# Patient Record
Sex: Male | Born: 1955 | Race: White | Hispanic: No | Marital: Married | Smoking: Never smoker
Health system: Southern US, Community
[De-identification: ages and names within clinical notes are randomized; demographics above are authoritative.]

## PROBLEM LIST (undated history)

## (undated) DIAGNOSIS — M1990 Unspecified osteoarthritis, unspecified site: Secondary | ICD-10-CM

## (undated) DIAGNOSIS — Z87442 Personal history of urinary calculi: Secondary | ICD-10-CM

## (undated) DIAGNOSIS — E039 Hypothyroidism, unspecified: Secondary | ICD-10-CM

## (undated) DIAGNOSIS — E538 Deficiency of other specified B group vitamins: Secondary | ICD-10-CM

## (undated) DIAGNOSIS — D759 Disease of blood and blood-forming organs, unspecified: Secondary | ICD-10-CM

## (undated) HISTORY — PX: OTHER SURGICAL HISTORY: SHX169

## (undated) HISTORY — PX: TONSILLECTOMY: SUR1361

---

## 2005-04-29 ENCOUNTER — Ambulatory Visit: Payer: Self-pay | Admitting: Unknown Physician Specialty

## 2010-03-15 ENCOUNTER — Ambulatory Visit: Payer: Self-pay | Admitting: Urology

## 2013-01-18 ENCOUNTER — Ambulatory Visit: Payer: Self-pay | Admitting: Internal Medicine

## 2014-01-21 DIAGNOSIS — E039 Hypothyroidism, unspecified: Secondary | ICD-10-CM | POA: Insufficient documentation

## 2014-01-21 DIAGNOSIS — E7800 Pure hypercholesterolemia, unspecified: Secondary | ICD-10-CM | POA: Insufficient documentation

## 2014-04-22 ENCOUNTER — Other Ambulatory Visit
Admit: 2014-04-22 | Disposition: A | Discharge status: Home / Self Care | Payer: Self-pay | Attending: Physician Assistant | Admitting: Physician Assistant

## 2014-04-22 LAB — COMPREHENSIVE METABOLIC PANEL
ALT: 34 U/L
AST: 34 U/L
Albumin: 3.8 g/dL
Alkaline Phosphatase: 71 U/L
Anion Gap: 11 (ref 7–16)
BUN: 26 mg/dL — ABNORMAL HIGH
Bilirubin,Total: 0.7 mg/dL
CALCIUM: 8.6 mg/dL — AB
Chloride: 104 mmol/L
Co2: 25 mmol/L
Creatinine: 1.13 mg/dL
EGFR (African American): >60
GLUCOSE: 91 mg/dL
Potassium: 3.7 mmol/L
Sodium: 140 mmol/L
Total Protein: 6.8 g/dL

## 2015-01-18 DIAGNOSIS — D696 Thrombocytopenia, unspecified: Secondary | ICD-10-CM

## 2015-01-18 HISTORY — DX: Thrombocytopenia, unspecified: D69.6

## 2015-01-23 DIAGNOSIS — D696 Thrombocytopenia, unspecified: Secondary | ICD-10-CM | POA: Insufficient documentation

## 2015-01-23 DIAGNOSIS — M1990 Unspecified osteoarthritis, unspecified site: Secondary | ICD-10-CM | POA: Insufficient documentation

## 2015-01-27 DIAGNOSIS — E538 Deficiency of other specified B group vitamins: Secondary | ICD-10-CM | POA: Insufficient documentation

## 2015-10-12 ENCOUNTER — Other Ambulatory Visit: Payer: Self-pay | Admitting: Physician Assistant

## 2015-10-12 DIAGNOSIS — M2392 Unspecified internal derangement of left knee: Secondary | ICD-10-CM

## 2015-10-15 ENCOUNTER — Ambulatory Visit
Admission: RE | Admit: 2015-10-15 | Discharge: 2015-10-15 | Disposition: A | Discharge status: Home / Self Care | Payer: BC Managed Care – PPO | Source: Ambulatory Visit | Attending: Physician Assistant | Admitting: Physician Assistant

## 2015-10-15 DIAGNOSIS — X58XXXA Exposure to other specified factors, initial encounter: Secondary | ICD-10-CM | POA: Diagnosis not present

## 2015-10-15 DIAGNOSIS — S83502A Sprain of unspecified cruciate ligament of left knee, initial encounter: Secondary | ICD-10-CM | POA: Diagnosis not present

## 2015-10-15 DIAGNOSIS — Q741 Congenital malformation of knee: Secondary | ICD-10-CM | POA: Insufficient documentation

## 2015-10-15 DIAGNOSIS — S83242A Other tear of medial meniscus, current injury, left knee, initial encounter: Secondary | ICD-10-CM | POA: Insufficient documentation

## 2015-10-15 DIAGNOSIS — M1712 Unilateral primary osteoarthritis, left knee: Secondary | ICD-10-CM | POA: Insufficient documentation

## 2015-10-15 DIAGNOSIS — M25462 Effusion, left knee: Secondary | ICD-10-CM | POA: Insufficient documentation

## 2015-10-15 DIAGNOSIS — M71562 Other bursitis, not elsewhere classified, left knee: Secondary | ICD-10-CM | POA: Diagnosis not present

## 2015-10-15 DIAGNOSIS — M84352A Stress fracture, left femur, initial encounter for fracture: Secondary | ICD-10-CM | POA: Diagnosis not present

## 2015-10-15 DIAGNOSIS — M25862 Other specified joint disorders, left knee: Secondary | ICD-10-CM | POA: Diagnosis not present

## 2015-10-15 DIAGNOSIS — M2392 Unspecified internal derangement of left knee: Secondary | ICD-10-CM

## 2015-11-25 ENCOUNTER — Encounter
Admission: RE | Admit: 2015-11-25 | Discharge: 2015-11-25 | Disposition: A | Discharge status: Home / Self Care | Payer: BC Managed Care – PPO | Source: Ambulatory Visit | Attending: Orthopedic Surgery | Admitting: Orthopedic Surgery

## 2015-11-25 HISTORY — DX: Disease of blood and blood-forming organs, unspecified: D75.9

## 2015-11-25 HISTORY — DX: Deficiency of other specified B group vitamins: E53.8

## 2015-11-25 HISTORY — DX: Unspecified osteoarthritis, unspecified site: M19.90

## 2015-11-25 HISTORY — DX: Hypothyroidism, unspecified: E03.9

## 2015-11-25 NOTE — Patient Instructions (Signed)
  Your procedure is scheduled on: 12/02/15 Report to Day Surgery.[facility] To find out your arrival time please call [phone] between 1PM - 3PM on 12/01/15  Remember: Instructions that are not followed completely may result in serious medical risk, up to and including death, or upon the discretion of your surgeon and anesthesiologist your surgery may need to be rescheduled.    _X___ 1. Do not eat food or drink liquids after midnight. No gum chewing or hard candies.     ____ 2. No Alcohol for 24 hours before or after surgery.   ____ 3. Do Not Smoke For 24 Hours Prior to Your Surgery.   ____ 4. Bring all medications with you on the day of surgery if instructed.    _X___ 5. Notify your doctor if there is any change in your medical condition     (cold, fever, infections).       Do not wear jewelry, make-up, hairpins, clips or nail polish.  Do not wear lotions, powders, or perfumes. You may wear deodorant.  Do not shave 48 hours prior to surgery. Men may shave face and neck.  Do not bring valuables to the hospital.    [facility] any belongings or valuables.               Contacts, dentures or bridgework may not be worn into surgery.  Leave your suitcase in the car. After surgery it may be brought to your room.  For patients admitted to the hospital, discharge time is determined by your                treatment team.   Patients discharged the day of surgery will not be allowed to drive home.      ___X Take these medicines the morning of surgery with A SIP OF WATER:    1.LEVOTHYROXINE  2.   3.   4.  5.  6.  ____ Fleet Enema (as directed)   __X__ Use CHG Soap as directed  ____ Use inhalers on the day of surgery  ____ Stop metformin 2 days prior to surgery    ____ Take 1/2 of usual insulin dose the night before surgery and none on the morning of surgery.   ____ Stop Coumadin/Plavix/aspirin on   __X__ Stop Anti-inflammatories on    STOP IBUPROFEN UNTIL AFTER SURGERY   ____ Stop supplements until after surgery.    ____ Bring C-Pap to the hospital.

## 2015-11-26 ENCOUNTER — Ambulatory Visit
Admission: RE | Admit: 2015-11-26 | Discharge: 2015-11-26 | Disposition: A | Discharge status: Home / Self Care | Payer: BC Managed Care – PPO | Source: Ambulatory Visit | Attending: Orthopedic Surgery | Admitting: Orthopedic Surgery

## 2015-11-26 DIAGNOSIS — Z01812 Encounter for preprocedural laboratory examination: Secondary | ICD-10-CM | POA: Diagnosis not present

## 2015-11-26 LAB — CBC
HCT: 45.9 % (ref 40.0–52.0)
Hemoglobin: 15.6 g/dL (ref 13.0–18.0)
MCH: 30 pg (ref 26.0–34.0)
MCHC: 34.1 g/dL (ref 32.0–36.0)
MCV: 88 fL (ref 80.0–100.0)
Platelets: 130 10*3/uL — ABNORMAL LOW (ref 150–440)
RBC: 5.21 MIL/uL (ref 4.40–5.90)
RDW: 13.5 % (ref 11.5–14.5)
WBC: 6.4 10*3/uL (ref 3.8–10.6)

## 2015-12-02 ENCOUNTER — Ambulatory Visit
Admission: RE | Admit: 2015-12-02 | Discharge: 2015-12-02 | Disposition: A | Discharge status: Home / Self Care | Payer: BC Managed Care – PPO | Source: Ambulatory Visit | Attending: Orthopedic Surgery | Admitting: Orthopedic Surgery

## 2015-12-02 ENCOUNTER — Ambulatory Visit: Payer: BC Managed Care – PPO | Admitting: Certified Registered Nurse Anesthetist

## 2015-12-02 ENCOUNTER — Encounter
Admission: RE | Disposition: A | Discharge status: Home / Self Care | Payer: Self-pay | Source: Ambulatory Visit | Attending: Orthopedic Surgery

## 2015-12-02 ENCOUNTER — Encounter: Payer: Self-pay | Admitting: Orthopedic Surgery

## 2015-12-02 DIAGNOSIS — Z820 Family history of epilepsy and other diseases of the nervous system: Secondary | ICD-10-CM | POA: Diagnosis not present

## 2015-12-02 DIAGNOSIS — E039 Hypothyroidism, unspecified: Secondary | ICD-10-CM | POA: Insufficient documentation

## 2015-12-02 DIAGNOSIS — Z791 Long term (current) use of non-steroidal anti-inflammatories (NSAID): Secondary | ICD-10-CM | POA: Insufficient documentation

## 2015-12-02 DIAGNOSIS — M1712 Unilateral primary osteoarthritis, left knee: Secondary | ICD-10-CM | POA: Insufficient documentation

## 2015-12-02 DIAGNOSIS — M25462 Effusion, left knee: Secondary | ICD-10-CM | POA: Insufficient documentation

## 2015-12-02 DIAGNOSIS — M94262 Chondromalacia, left knee: Secondary | ICD-10-CM | POA: Diagnosis not present

## 2015-12-02 DIAGNOSIS — Z8042 Family history of malignant neoplasm of prostate: Secondary | ICD-10-CM | POA: Diagnosis not present

## 2015-12-02 DIAGNOSIS — Z9889 Other specified postprocedural states: Secondary | ICD-10-CM | POA: Diagnosis not present

## 2015-12-02 DIAGNOSIS — M23222 Derangement of posterior horn of medial meniscus due to old tear or injury, left knee: Secondary | ICD-10-CM | POA: Diagnosis not present

## 2015-12-02 DIAGNOSIS — Z79899 Other long term (current) drug therapy: Secondary | ICD-10-CM | POA: Insufficient documentation

## 2015-12-02 DIAGNOSIS — E538 Deficiency of other specified B group vitamins: Secondary | ICD-10-CM | POA: Insufficient documentation

## 2015-12-02 DIAGNOSIS — Z86010 Personal history of colonic polyps: Secondary | ICD-10-CM | POA: Diagnosis not present

## 2015-12-02 DIAGNOSIS — M2392 Unspecified internal derangement of left knee: Secondary | ICD-10-CM | POA: Diagnosis present

## 2015-12-02 HISTORY — PX: KNEE ARTHROSCOPY WITH MEDIAL MENISECTOMY: SHX5651

## 2015-12-02 HISTORY — PX: CHONDROPLASTY: SHX5177

## 2015-12-02 SURGERY — ARTHROSCOPY, KNEE, WITH MEDIAL MENISCECTOMY
Anesthesia: General | Site: Knee | Laterality: Left | Wound class: Clean

## 2015-12-02 MED ORDER — MORPHINE SULFATE (PF) 4 MG/ML IV SOLN
INTRAVENOUS | Status: AC
  Filled 2015-12-02: qty 1

## 2015-12-02 MED ORDER — MORPHINE SULFATE (PF) 4 MG/ML IV SOLN
INTRAVENOUS | Status: DC | PRN
  Administered 2015-12-02: 4 mg

## 2015-12-02 MED ORDER — HYDROCODONE-ACETAMINOPHEN 5-325 MG PO TABS
ORAL_TABLET | ORAL | Status: AC
  Filled 2015-12-02: qty 1

## 2015-12-02 MED ORDER — ONDANSETRON HCL 4 MG/2ML IJ SOLN
INTRAMUSCULAR | Status: DC | PRN
  Administered 2015-12-02: 4 mg via INTRAVENOUS

## 2015-12-02 MED ORDER — FENTANYL CITRATE (PF) 100 MCG/2ML IJ SOLN
25.0000 ug | INTRAMUSCULAR | Status: DC | PRN
  Administered 2015-12-02 (×2): 50 ug via INTRAVENOUS

## 2015-12-02 MED ORDER — MIDAZOLAM HCL 2 MG/2ML IJ SOLN
INTRAMUSCULAR | Status: DC | PRN
  Administered 2015-12-02: 1 mg via INTRAVENOUS

## 2015-12-02 MED ORDER — ACETAMINOPHEN 10 MG/ML IV SOLN
INTRAVENOUS | Status: AC
  Filled 2015-12-02: qty 100

## 2015-12-02 MED ORDER — FAMOTIDINE 20 MG PO TABS
20.0000 mg | ORAL_TABLET | Freq: Once | ORAL | Status: AC
  Administered 2015-12-02: 20 mg via ORAL

## 2015-12-02 MED ORDER — FENTANYL CITRATE (PF) 100 MCG/2ML IJ SOLN
INTRAMUSCULAR | Status: DC | PRN
  Administered 2015-12-02: 25 ug via INTRAVENOUS
  Administered 2015-12-02: 50 ug via INTRAVENOUS

## 2015-12-02 MED ORDER — LACTATED RINGERS IV SOLN
INTRAVENOUS | Status: DC
  Administered 2015-12-02: 11:00:00 via INTRAVENOUS

## 2015-12-02 MED ORDER — BUPIVACAINE-EPINEPHRINE (PF) 0.25% -1:200000 IJ SOLN
INTRAMUSCULAR | Status: AC
  Filled 2015-12-02: qty 30

## 2015-12-02 MED ORDER — PROPOFOL 10 MG/ML IV BOLUS
INTRAVENOUS | Status: DC | PRN
  Administered 2015-12-02: 150 mg via INTRAVENOUS

## 2015-12-02 MED ORDER — FAMOTIDINE 20 MG PO TABS
ORAL_TABLET | ORAL | Status: AC
  Administered 2015-12-02: 20 mg via ORAL
  Filled 2015-12-02: qty 1

## 2015-12-02 MED ORDER — KETAMINE HCL 50 MG/ML IJ SOLN
INTRAMUSCULAR | Status: DC | PRN
  Administered 2015-12-02: 45 mg via INTRAVENOUS

## 2015-12-02 MED ORDER — ONDANSETRON HCL 4 MG PO TABS
4.0000 mg | ORAL_TABLET | Freq: Four times a day (QID) | ORAL | Status: DC | PRN
Start: 2015-12-02 — End: 2015-12-02

## 2015-12-02 MED ORDER — ACETAMINOPHEN 10 MG/ML IV SOLN
INTRAVENOUS | Status: DC | PRN
  Administered 2015-12-02: 1000 mg via INTRAVENOUS

## 2015-12-02 MED ORDER — MEPERIDINE HCL 25 MG/ML IJ SOLN: 6.2500 mg | INTRAMUSCULAR | Status: DC | PRN

## 2015-12-02 MED ORDER — FENTANYL CITRATE (PF) 100 MCG/2ML IJ SOLN
INTRAMUSCULAR | Status: AC
  Filled 2015-12-02: qty 2

## 2015-12-02 MED ORDER — CHLORHEXIDINE GLUCONATE 4 % EX LIQD
60.0000 mL | Freq: Once | CUTANEOUS | Status: AC
  Administered 2015-12-02: 4 via TOPICAL

## 2015-12-02 MED ORDER — LIDOCAINE HCL (CARDIAC) 20 MG/ML IV SOLN
INTRAVENOUS | Status: DC | PRN
  Administered 2015-12-02: 100 mg via INTRAVENOUS

## 2015-12-02 MED ORDER — BUPIVACAINE-EPINEPHRINE 0.25% -1:200000 IJ SOLN
INTRAMUSCULAR | Status: DC | PRN
  Administered 2015-12-02: 25 mL
  Administered 2015-12-02: 5 mL

## 2015-12-02 MED ORDER — GLYCOPYRROLATE 0.2 MG/ML IJ SOLN
INTRAMUSCULAR | Status: DC | PRN
  Administered 2015-12-02: 0.3 mg via INTRAVENOUS

## 2015-12-02 MED ORDER — METOCLOPRAMIDE HCL 5 MG/ML IJ SOLN: 5.0000 mg | Freq: Three times a day (TID) | INTRAMUSCULAR | Status: DC | PRN

## 2015-12-02 MED ORDER — SODIUM CHLORIDE 0.9 % IV SOLN: INTRAVENOUS | Status: DC

## 2015-12-02 MED ORDER — HYDROCODONE-ACETAMINOPHEN 5-325 MG PO TABS
1.0000 | ORAL_TABLET | ORAL | Status: DC | PRN
  Administered 2015-12-02: 1 via ORAL

## 2015-12-02 MED ORDER — METOCLOPRAMIDE HCL 10 MG PO TABS
5.0000 mg | ORAL_TABLET | Freq: Three times a day (TID) | ORAL | Status: DC | PRN
Start: 2015-12-02 — End: 2015-12-02

## 2015-12-02 MED ORDER — PROMETHAZINE HCL 25 MG/ML IJ SOLN: 6.2500 mg | INTRAMUSCULAR | Status: DC | PRN

## 2015-12-02 MED ORDER — HYDROCODONE-ACETAMINOPHEN 5-325 MG PO TABS: 1.0000 | ORAL_TABLET | ORAL | 0 refills | Status: DC | PRN

## 2015-12-02 MED ORDER — OXYCODONE HCL 5 MG PO TABS: 5.0000 mg | ORAL_TABLET | Freq: Once | ORAL | Status: DC | PRN

## 2015-12-02 MED ORDER — ONDANSETRON HCL 4 MG/2ML IJ SOLN: 4.0000 mg | Freq: Four times a day (QID) | INTRAMUSCULAR | Status: DC | PRN

## 2015-12-02 MED ORDER — OXYCODONE HCL 5 MG/5ML PO SOLN: 5.0000 mg | Freq: Once | ORAL | Status: DC | PRN

## 2015-12-02 SURGICAL SUPPLY — 23 items
BLADE SHAVER 4.5 DBL SERAT CV (CUTTER) ×3 IMPLANT
BNDG ESMARK 6X12 TAN STRL LF (GAUZE/BANDAGES/DRESSINGS) ×3 IMPLANT
CUFF TOURN 24 STER (MISCELLANEOUS) ×3 IMPLANT
CUFF TOURN 30 STER DUAL PORT (MISCELLANEOUS) IMPLANT
DRSG DERMACEA 8X12 NADH (GAUZE/BANDAGES/DRESSINGS) ×3 IMPLANT
DURAPREP 26ML APPLICATOR (WOUND CARE) ×6 IMPLANT
GAUZE SPONGE 4X4 12PLY STRL (GAUZE/BANDAGES/DRESSINGS) ×3 IMPLANT
GLOVE BIOGEL M STRL SZ7.5 (GLOVE) ×3 IMPLANT
GLOVE INDICATOR 8.0 STRL GRN (GLOVE) ×3 IMPLANT
GOWN STRL REUS W/ TWL LRG LVL3 (GOWN DISPOSABLE) ×4 IMPLANT
GOWN STRL REUS W/TWL LRG LVL3 (GOWN DISPOSABLE) ×2
IV LACTATED RINGER IRRG 3000ML (IV SOLUTION) ×6
IV LR IRRIG 3000ML ARTHROMATIC (IV SOLUTION) ×12 IMPLANT
KIT RM TURNOVER STRD PROC AR (KITS) ×3 IMPLANT
MANIFOLD NEPTUNE II (INSTRUMENTS) ×3 IMPLANT
PACK ARTHROSCOPY KNEE (MISCELLANEOUS) ×3 IMPLANT
SET TUBE SUCT SHAVER OUTFL 24K (TUBING) ×3 IMPLANT
SET TUBE TIP INTRA-ARTICULAR (MISCELLANEOUS) ×3 IMPLANT
SUT ETHILON 3-0 FS-10 30 BLK (SUTURE) ×3
SUTURE EHLN 3-0 FS-10 30 BLK (SUTURE) ×2 IMPLANT
TUBING ARTHRO INFLOW-ONLY STRL (TUBING) ×3 IMPLANT
WAND HAND CNTRL MULTIVAC 50 (MISCELLANEOUS) ×3 IMPLANT
WRAP KNEE W/COLD PACKS 25.5X14 (SOFTGOODS) ×3 IMPLANT

## 2015-12-02 NOTE — Op Note (Signed)
OPERATIVE NOTE  DATE OF SURGERY:  12/02/2015  PATIENT NAME:  Darrold SpanCharles R Wigen   DOB: 08-05-1955  MRN: [id number]   PRE-OPERATIVE DIAGNOSIS:  Internal derangement of the left knee   POST-OPERATIVE DIAGNOSIS:   Tear of the posterior horn of the medial meniscus, left knee Grade 3 chondromalacia of the medial femoral condyle, left knee  PROCEDURE:  Left knee arthroscopy, partial medial meniscectomy, and chondroplasty  SURGEON:  Jena GaussJames P Ulises Wolfinger, Jr., M.D.   ASSISTANT: none  ANESTHESIA: general  ESTIMATED BLOOD LOSS: Minimal  FLUIDS REPLACED: 900 mL of crystalloid  TOURNIQUET TIME: Not used   DRAINS: none  IMPLANTS UTILIZED: None  INDICATIONS FOR SURGERY: Darrold SpanCharles R Winburn is a 60 y.o. year old male who has been seen for complaints of left knee pain. MRI demonstrated findings consistent with meniscal pathology. After discussion of the risks and benefits of surgical intervention, the patient expressed understanding of the risks benefits and agree with plans for left knee arthroscopy.   PROCEDURE IN DETAIL: The patient was brought into the operating room and, after adequate general anesthesia was achieved, a tourniquet was applied to the left thigh and the leg was placed in the leg holder. All bony prominences were well padded. The patient's left knee was cleaned and prepped with alcohol and Duraprep and draped in the usual sterile fashion. A "timeout" was performed as per usual protocol. The anticipated portal sites were injected with 0.25% Marcaine with epinephrine. An anterolateral incision was made and a cannula was inserted. A small effusion was evacuated and the knee was distended with fluid using the pump. The scope was advanced down the medial gutter into the medial compartment. Under visualization with the scope, an anteromedial portal was created and a hooked probe was inserted. The medial meniscus was visualized and probed. There was a complex tear of the posterior horn of medial  meniscus. The tear was debrided using meniscal punches and a 4.5 mm shaver. Final contouring was performed using a 50 ArthroCare wand. The remaining rim of meniscus was visualized and probed and felt to be stable. The articular cartilage was visualized. There was a localized area of grade 3 chondromalacia involving the medial femoral condyle. The area was debrided and contoured using the ArthroCare wand.  The scope was then advanced into the intercondylar notch. The anterior cruciate ligament was visualized and probed and felt to be intact. The scope was removed from the lateral portal and reinserted via the anteromedial portal to better visualize the lateral compartment. The lateral meniscus was visualized and probed. The lateral meniscus was intact without evidence of tear or instability. The articular cartilage of the lateral compartment was visualized and felt to be in good condition. Finally, the scope was advanced so as to visualize the patellofemoral articulation which was in good condition.Peri Jefferson. Good patellar tracking was appreciated.   The knee was irrigated with copius amounts of fluid and suctioned dry. The anterolateral portal was re-approximated with #3-0 nylon. A combination of 0.25% Marcaine with epinephrine and 4 mg of Morphine were injected via the scope. The scope was removed and the anteromedial portal was re-approximated with #3-0 nylon. A sterile dressing was applied followed by application of an ice wrap.  The patient tolerated the procedure well and was transported to the PACU in stable condition.  Quentavious Rittenhouse P. Angie FavaHooten, Jr., M.D.

## 2015-12-02 NOTE — Brief Op Note (Signed)
12/02/2015  12:54 PM  PATIENT:  Justin Lara    PRE-OPERATIVE DIAGNOSIS:  INTERNAL DERANGEMENT of the left knee  POST-OPERATIVE DIAGNOSIS:  tear posterior horn medial meniscus, grade 3 chondromalcia medial femoral chondyle left knee  PROCEDURE:  Procedure(s): KNEE ARTHROSCOPY WITH MEDIAL MENISECTOMY (Left) CHONDROPLASTY (Left)  SURGEON:  Surgeon(s) and Role:    * Donato HeinzJames P Guillermo Difrancesco, MD - Primary  ASSISTANTS: none   ANESTHESIA:   general  EBL:  Total I/O In: 900 [I.V.:900] Out: 1 [Blood:1]  BLOOD ADMINISTERED:none  DRAINS: none   LOCAL MEDICATIONS USED:  MARCAINE     SPECIMEN:  No Specimen  DISPOSITION OF SPECIMEN:  N/A  COUNTS:  YES  TOURNIQUET:    DICTATION: .Dragon Dictation  PLAN OF CARE: Discharge to home after PACU  PATIENT DISPOSITION:  PACU - hemodynamically stable.   Delay start of Pharmacological VTE agent (>24hrs) due to surgical blood loss or risk of bleeding: not applicable

## 2015-12-02 NOTE — Progress Notes (Signed)
Pedal pulse positive to left foot 

## 2015-12-02 NOTE — Anesthesia Procedure Notes (Signed)
Procedure Name: LMA Insertion Date/Time: 12/02/2015 11:29 AM Performed by: Shirlee LimerickMARION, Justin Sandler Pre-anesthesia Checklist: Patient identified, Emergency Drugs available, Suction available and Patient being monitored Patient Re-evaluated:Patient Re-evaluated prior to inductionOxygen Delivery Method: Circle system utilized Preoxygenation: Pre-oxygenation with 100% oxygen Intubation Type: IV induction LMA: LMA inserted LMA Size: 5.0 Number of attempts: 1 Placement Confirmation: breath sounds checked- equal and bilateral Tube secured with: Tape Dental Injury: Teeth and Oropharynx as per pre-operative assessment

## 2015-12-02 NOTE — Progress Notes (Signed)
States no real pain   No bleeding noted on dressing

## 2015-12-02 NOTE — Anesthesia Preprocedure Evaluation (Signed)
Anesthesia Evaluation  Patient identified by MRN, date of birth, ID band Patient awake    Reviewed: Allergy & Precautions, NPO status , Patient's Chart, lab work & pertinent test results  History of Anesthesia Complications Negative for: history of anesthetic complications  Airway Mallampati: II  TM Distance: >3 FB Neck ROM: Full    Dental  (+) Implants   Pulmonary neg pulmonary ROS, neg sleep apnea, neg COPD,    breath sounds clear to auscultation- rhonchi (-) wheezing      Cardiovascular Exercise Tolerance: Good (-) hypertension(-) CAD and (-) Past MI  Rhythm:Regular Rate:Normal - Systolic murmurs and - Diastolic murmurs    Neuro/Psych negative neurological ROS  negative psych ROS   GI/Hepatic negative GI ROS, Neg liver ROS,   Endo/Other  neg diabetesHypothyroidism   Renal/GU negative Renal ROS     Musculoskeletal  (+) Arthritis ,   Abdominal (+) - obese,   Peds  Hematology negative hematology ROS (+)   Anesthesia Other Findings Past Medical History: No date: Arthritis No date: Blood dyscrasia     Comment: thrombocytopenia No date: Deficiency of other specified B group vitamins*     Comment: b 12 No date: Hypothyroidism   Reproductive/Obstetrics                             Anesthesia Physical Anesthesia Plan  ASA: II  Anesthesia Plan: General   Post-op Pain Management:    Induction: Intravenous  Airway Management Planned: LMA  Additional Equipment:   Intra-op Plan:   Post-operative Plan:   Informed Consent: I have reviewed the patients History and Physical, chart, labs and discussed the procedure including the risks, benefits and alternatives for the proposed anesthesia with the patient or authorized representative who has indicated his/her understanding and acceptance.   Dental advisory given  Plan Discussed with: CRNA and Anesthesiologist  Anesthesia Plan  Comments:         Lab Results  Component Value Date   WBC 6.4 11/26/2015   HGB 15.6 11/26/2015   HCT 45.9 11/26/2015   MCV 88.0 11/26/2015   PLT 130 (L) 11/26/2015    Anesthesia Quick Evaluation

## 2015-12-02 NOTE — H&P (Signed)
The patient has been re-examined, and the chart reviewed, and there have been no interval changes to the documented history and physical.    The risks, benefits, and alternatives have been discussed at length. The patient expressed understanding of the risks benefits and agreed with plans for surgical intervention.  Aamirah Salmi P. Lucan Riner, Jr. M.D.    

## 2015-12-02 NOTE — Transfer of Care (Signed)
Immediate Anesthesia Transfer of Care Note  Patient: Justin Lara  Procedure(s) Performed: Procedure(s): KNEE ARTHROSCOPY WITH MEDIAL MENISECTOMY (Left) CHONDROPLASTY (Left)  Patient Location: PACU  Anesthesia Type:General  Level of Consciousness: awake and patient cooperative  Airway & Oxygen Therapy: Patient Spontanous Breathing and Patient connected to nasal cannula oxygen  Post-op Assessment: Report given to RN and Post -op Vital signs reviewed and stable  Post vital signs: Reviewed and stable  Last Vitals:  Vitals:   12/02/15 1010 12/02/15 1256  BP: 121/76 131/82  Pulse: (!) 54 71  Resp: 16 11  Temp: 36.9 C 36.3 C    Last Pain:  Vitals:   12/02/15 1010  TempSrc: Tympanic         Complications: No apparent anesthesia complications

## 2015-12-02 NOTE — Discharge Instructions (Signed)
°  Instructions after Knee Arthroscopy  ° ° Brande Uncapher P. Caleb Decock, Jr., M.D.    ° Dept. of Orthopaedics & Sports Medicine ° [facility] ° [address] ° [place], [place]  [address] ° ° Phone: [phone]   Fax: [phone] ° ° °DIET: °• Drink plenty of non-alcoholic fluids & begin a light diet. °• Resume your normal diet the day after surgery. ° °ACTIVITY:  °• You may use crutches or a walker with weight-bearing as tolerated, unless instructed otherwise. °• You may wean yourself off of the walker or crutches as tolerated.  °• Begin doing gentle exercises. Exercising will reduce the pain and swelling, increase motion, and prevent muscle weakness.   °• Avoid strenuous activities or athletics for a minimum of 4-6 weeks after arthroscopic surgery. °• Do not drive or operate any equipment until instructed. ° °WOUND CARE:  °• Place one to two pillows under the knee the first day or two when sitting or lying.  °• Continue to use the ice packs periodically to reduce pain and swelling. °• The small incisions in your knee are closed with nylon stitches. The stitches will be removed in the office. °• The bulky dressing may be removed on the second day after surgery. DO NOT TOUCH THE STITCHES. Put a Band-Aid over each stitch. Do NOT use any ointments or creams on the incisions.  °• You may bathe or shower after the stitches are removed at the first office visit following surgery. ° °MEDICATIONS: °• You may resume your regular medications. °• Please take the pain medication as prescribed. °• Do not take pain medication on an empty stomach. °• Do not drive or drink alcoholic beverages when taking pain medications. ° °CALL THE OFFICE FOR: °• Temperature above 101 degrees °• Excessive bleeding or drainage on the dressing. °• Excessive swelling, coldness, or paleness of the toes. °• Persistent nausea and vomiting. ° °FOLLOW-UP:  °• You should have an appointment to return to the office in 7-10 days after surgery.  °  °

## 2015-12-02 NOTE — Anesthesia Postprocedure Evaluation (Signed)
Anesthesia Post Note  Patient: Darrold SpanCharles R Freitas  Procedure(s) Performed: Procedure(s) (LRB): KNEE ARTHROSCOPY WITH MEDIAL MENISECTOMY (Left) CHONDROPLASTY (Left)  Patient location during evaluation: PACU Anesthesia Type: General Level of consciousness: awake and alert and oriented Pain management: pain level controlled Vital Signs Assessment: post-procedure vital signs reviewed and stable Respiratory status: spontaneous breathing, nonlabored ventilation and respiratory function stable Cardiovascular status: blood pressure returned to baseline and stable Postop Assessment: no signs of nausea or vomiting Anesthetic complications: no    Last Vitals:  Vitals:   12/02/15 1355 12/02/15 1425  BP: 137/79 133/77  Pulse: (!) 54 (!) 50  Resp: 16   Temp: 36.4 C     Last Pain:  Vitals:   12/02/15 1358  TempSrc:   PainSc: 3                  Alexcia [employer]

## 2015-12-03 ENCOUNTER — Encounter: Payer: Self-pay | Admitting: Orthopedic Surgery

## 2017-07-17 ENCOUNTER — Other Ambulatory Visit: Payer: Self-pay | Admitting: Family Medicine

## 2017-07-17 DIAGNOSIS — R1011 Right upper quadrant pain: Secondary | ICD-10-CM

## 2017-07-18 ENCOUNTER — Ambulatory Visit
Admission: RE | Admit: 2017-07-18 | Discharge: 2017-07-18 | Disposition: A | Discharge status: Home / Self Care | Payer: BC Managed Care – PPO | Source: Ambulatory Visit | Attending: Family Medicine | Admitting: Family Medicine

## 2017-07-18 ENCOUNTER — Other Ambulatory Visit (HOSPITAL_COMMUNITY): Payer: Self-pay | Admitting: Family Medicine

## 2017-07-18 DIAGNOSIS — K8020 Calculus of gallbladder without cholecystitis without obstruction: Secondary | ICD-10-CM | POA: Insufficient documentation

## 2017-07-18 DIAGNOSIS — R1011 Right upper quadrant pain: Secondary | ICD-10-CM | POA: Diagnosis not present

## 2017-07-18 DIAGNOSIS — N281 Cyst of kidney, acquired: Secondary | ICD-10-CM

## 2017-07-18 DIAGNOSIS — K7689 Other specified diseases of liver: Secondary | ICD-10-CM | POA: Insufficient documentation

## 2017-07-25 ENCOUNTER — Ambulatory Visit
Admission: RE | Admit: 2017-07-25 | Discharge: 2017-07-25 | Disposition: A | Discharge status: Home / Self Care | Payer: BC Managed Care – PPO | Source: Ambulatory Visit | Attending: Family Medicine | Admitting: Family Medicine

## 2017-07-25 DIAGNOSIS — N281 Cyst of kidney, acquired: Secondary | ICD-10-CM | POA: Diagnosis not present

## 2017-07-25 DIAGNOSIS — K8020 Calculus of gallbladder without cholecystitis without obstruction: Secondary | ICD-10-CM

## 2017-07-25 DIAGNOSIS — N200 Calculus of kidney: Secondary | ICD-10-CM | POA: Diagnosis present

## 2017-07-26 ENCOUNTER — Ambulatory Visit: Payer: Self-pay | Admitting: Surgery

## 2017-07-26 NOTE — H&P (Signed)
Progress Notes by Sung AmabileSakai, Deandra Gadson, DO at 07/24/2017 9:45 AM   Author: Sung AmabileSakai, Paxten Appelt, DO Service: - Author Type: Physician  Filed: 07/24/2017 12:50 PM Encounter Date: 07/24/2017 Note Type: Progress Notes  Editor: Sung AmabileSakai, Keaundra Stehle, DO (Physician)     Show:Clear all [x] Manual[x] Template[x] Copied  Added by: [x] Dossie Swor, DO  [] Hover for details Subjective:   CC: chronic cholecystitis  HPI:  Justin Lara is a 62 y.o. male who was referred by PA whitaker for evaluation of  Chronic cholecystitis.  Symptoms were first noted 2 weeks ago. Symptoms did not start at work. Pain is sharp, intermittent started at night and woke him up from sleep.  Lasted couple hours and resolved on own.  Another similar episode happened a week ago at which point workup started and revealed possible chronic cholecystitis on US so referred to us.  Exacerbated by nothing specific.  Alleviated by nothing specific.  Cannot make any associations with food, specific body positions/movements.     Past Medical History:  has a past medical history of Allergic rhinitis, B12 deficiency (01/27/2015), Chicken pox, Chronic osteoarthritis (01/23/2015), OA (osteoarthritis), Pure hypercholesterolemia (01/21/2014), and Thrombocytopenia (CMS-HCC) (01/23/2015). Past Surgical History:  has a past surgical history that includes Tonsillectomy (1973); Colonoscopy (04/29/2005); Colonoscopy (03/31/2010); Colonoscopy (03/25/2015); and Left knee arthroscopy, partial medial meniscetomy and chondroplasty (12/02/2015).  Family History: family history includes Parkinsonism in his mother; Prostate cancer in his father.   Social History:  reports that he has never smoked. He has never used smokeless tobacco. He reports that he does not drink alcohol or use drugs.   Current Medications: has a current medication list which includes the following prescription(s): cyanocobalamin and levothyroxine.  Allergies:  No Known Allergies   ROS: A comprehensive  review of systems was asked and was negative.     Objective:     BP 106/70   Pulse 61   Temp 36.6 C (97.8 F) (Oral)   Ht 183 cm (6' 0.05")   Wt 97.5 kg (215 lb)   BMI 29.12 kg/m   Constitutional :  alert, appears stated age and cooperative  Throat:  no asymmetry, masses, or scars  Respiratory:  clear to auscultation bilaterally  Cardiovascular:  regular rate and rhythm, S1, S2 normal, no murmur, click, rub or gallop and regular rate and rhythm  Gastrointestinal: soft, non-tender; bowel sounds normal; no masses,  no organomegaly.  Negative Murphy's sign  Musculoskeletal: Steady gait and movement  Skin: Cool and moist, no surgical scars  Psychiatric: Normal affect, non-agitated, not confused       LABS:  -      Lab Results  Component Value Date   WBC 7.6 07/17/2017   HGB 15.4 07/17/2017   HCT 44.9 07/17/2017   PLT 145 (L) 07/17/2017   -      Lab Results  Component Value Date   NA 143 07/17/2017   K 3.8 07/17/2017   CL 107 07/17/2017   CO2 30.0 07/17/2017   BUN 20 07/17/2017   CREATININE 1.1 07/17/2017   CALCIUM 9.2 07/17/2017   ALB 4.1 07/17/2017   TBILI 0.4 07/17/2017   ALKPHOS 63 07/17/2017   AST 15 07/17/2017   ALT 13 07/17/2017   GLUCOSE 74 07/17/2017   GFR 67 07/17/2017     RADS: Procedure Name Priority Date/Time Associated Diagnosis Comments  US ABDOMEN LIMITED RUQ STAT 07/18/2017 11:44 AM EDT RUQ abdominal pain  Results for this procedure are in the results section.   Imaging Results - documented in this  encounter  US ABDOMEN LIMITED RUQ (07/18/2017 11:44 AM EDT)    US ABDOMEN LIMITED RUQ (07/18/2017 11:44 AM EDT)  Narrative Performed At  CLINICAL DATA:RUQ abdominal pain for the past 3 days    EXAM:  ULTRASOUND ABDOMEN LIMITED RIGHT UPPER QUADRANT    COMPARISON:None.    FINDINGS:  Gallbladder:    The gallbladder is adequately distended. Stones measuring up to 1.6  cm are present. There is  thickening of the gallbladder wall to 8.3  mm and there is pericholecystic fluid. There is no positive  sonographic Murphy's sign.    Common bile duct:    Diameter: 5.3 mm    Liver:    The hepatic echotexture is normal. There are multiple hepatic cysts.  The largest lies in the right lobe and measures 2.8 x 2.2 x 1.3 cm.  There is no intrahepatic ductal dilation. The portal vein is patent  with normal flow direction toward the liver.    IMPRESSION:  Gallstones with findings that likely reflect subacute or chronic  cholecystitis.    Simple appearing hepatic cysts.    Incidental note is made of a large cyst in the upper pole of the  right kidney containing a few internal echoes. A dedicated renal  ultrasound is recommended.      Electronically Signed  By: Linton Rump M.D.  On: 07/18/2017 11:51         Assessment:   Chronic cholecystitis Hypothyroid B12 deficiency thrombocytopenia  Plan:   1. Alternatives include continued observation.  Benefits include possible symptom relief, prevention of complications including acute cholecystitis, pancreatitis. Discussed the risk of surgery including post-op infxn, seroma, biloma, chronic pain, poor-delayed wound healing, retained gallstone, conversion to open procedure, post-op SBO or ileus, and need for additional procedures to address said risks.  The risks of general anesthetic including MI, CVA, sudden death or even reaction to anesthetic medications also discussed.  Typical post operative recovery of 3-5 days rest, continued pain in area and incision sites, possible loose stools up to 4-6 weeks, also discussed. The patient understands the risks, any and all questions were answered to the patient's satisfaction.  2. Patient has elected to proceed with surgical treatment. Procedure will be scheduled.  Written consent was obtained.  3. Hypothyroid -stable.  Continue current  meds  4. B12 deficiency -stable.  CBC normal.  Continue current meds  5. Thrombocytopenia -noted on last CBC at 145.  No clinical sign of easy bleeding.  No need for intervention at this point.  Of the 40 minutes I spent with the patient, 15 minutes were spent discussing pathophysiology, risks, benefits, alternatives to the treatment options    Electronically signed by Sung Amabile, DO on 07/24/2017 12:50 PM

## 2017-07-26 NOTE — H&P (Signed)
Sung AmabileSakai, Immanuel Fedak, DO - 07/24/2017 9:45 AM EDT Formatting of this note might be different from the original. Subjective:   CC: chronic cholecystitis  HPI: Justin Lara is a 62 y.o. male who was referred by PA whitaker for evaluation of Chronic cholecystitis. Symptoms were first noted 2 weeks ago. Symptoms did not start at work. Pain is sharp, intermittent started at night and woke him up from sleep. Lasted couple hours and resolved on own. Another similar episode happened a week ago at which point workup started and revealed possible chronic cholecystitis on US so referred to us. Exacerbated by nothing specific. Alleviated by nothing specific. Cannot make any associations with food, specific body positions/movements.   Past Medical History: has a past medical history of Allergic rhinitis, B12 deficiency (01/27/2015), Chicken pox, Chronic osteoarthritis (01/23/2015), OA (osteoarthritis), Pure hypercholesterolemia (01/21/2014), and Thrombocytopenia (CMS-HCC) (01/23/2015). Past Surgical History: has a past surgical history that includes Tonsillectomy (1973); Colonoscopy (04/29/2005); Colonoscopy (03/31/2010); Colonoscopy (03/25/2015); and Left knee arthroscopy, partial medial meniscetomy and chondroplasty (12/02/2015).  Family History: family history includes Parkinsonism in his mother; Prostate cancer in his father.   Social History: reports that he has never smoked. He has never used smokeless tobacco. He reports that he does not drink alcohol or use drugs.   Current Medications: has a current medication list which includes the following prescription(s): cyanocobalamin and levothyroxine.  Allergies:  No Known Allergies  ROS: A comprehensive review of systems was asked and was negative.   Objective:   BP 106/70  Pulse 61  Temp 36.6 C (97.8 F) (Oral)  Ht 183 cm (6' 0.05")  Wt 97.5 kg (215 lb)  BMI 29.12 kg/m   Constitutional : alert, appears stated age and cooperative  Throat: no asymmetry,  masses, or scars  Respiratory: clear to auscultation bilaterally  Cardiovascular: regular rate and rhythm, S1, S2 normal, no murmur, click, rub or gallop and regular rate and rhythm  Gastrointestinal: soft, non-tender; bowel sounds normal; no masses, no organomegaly. Negative Murphy's sign  Musculoskeletal: Steady gait and movement  Skin: Cool and moist, no surgical scars  Psychiatric: Normal affect, non-agitated, not confused    LABS:  - Lab Results  Component Value Date  WBC 7.6 07/17/2017  HGB 15.4 07/17/2017  HCT 44.9 07/17/2017  PLT 145 (L) 07/17/2017   - Lab Results  Component Value Date  NA 143 07/17/2017  K 3.8 07/17/2017  CL 107 07/17/2017  CO2 30.0 07/17/2017  BUN 20 07/17/2017  CREATININE 1.1 07/17/2017  CALCIUM 9.2 07/17/2017  ALB 4.1 07/17/2017  TBILI 0.4 07/17/2017  ALKPHOS 63 07/17/2017  AST 15 07/17/2017  ALT 13 07/17/2017  GLUCOSE 74 07/17/2017  GFR 67 07/17/2017    RADS: Procedure Name Priority Date/Time Associated Diagnosis Comments  US ABDOMEN LIMITED RUQ STAT 07/18/2017 11:44 AM EDT RUQ abdominal pain Results for this procedure are in the results section.  Imaging Results - documented in this encounter  US ABDOMEN LIMITED RUQ (07/18/2017 11:44 AM EDT) US ABDOMEN LIMITED RUQ (07/18/2017 11:44 AM EDT)  Narrative Performed At  CLINICAL DATA:RUQ abdominal pain for the past 3 days    EXAM:  ULTRASOUND ABDOMEN LIMITED RIGHT UPPER QUADRANT    COMPARISON:None.    FINDINGS:  Gallbladder:    The gallbladder is adequately distended. Stones measuring up to 1.6  cm are present. There is thickening of the gallbladder wall to 8.3  mm and there is pericholecystic fluid. There is no positive  sonographic Murphy's sign.    Common bile duct:  Diameter: 5.3 mm    Liver:    The hepatic echotexture is normal. There are multiple hepatic cysts.  The largest lies in the right lobe and measures 2.8 x 2.2 x 1.3 cm.  There  is no intrahepatic ductal dilation. The portal vein is patent  with normal flow direction toward the liver.    IMPRESSION:  Gallstones with findings that likely reflect subacute or chronic  cholecystitis.    Simple appearing hepatic cysts.    Incidental note is made of a large cyst in the upper pole of the  right kidney containing a few internal echoes. A dedicated renal  ultrasound is recommended.      Electronically Signed  By: Linton Rump M.D.  On: 07/18/2017 11:51     Assessment:   Chronic cholecystitis Hypothyroid B12 deficiency thrombocytopenia  Plan:    1. Alternatives include continued observation. Benefits include possible symptom relief, prevention of complications including acute cholecystitis, pancreatitis. Discussed the risk of surgery including post-op infxn, seroma, biloma, chronic pain, poor-delayed wound healing, retained gallstone, conversion to open procedure, post-op SBO or ileus, and need for additional procedures to address said risks. The risks of general anesthetic including MI, CVA, sudden death or even reaction to anesthetic medications also discussed.  Typical post operative recovery of 3-5 days rest, continued pain in area and incision sites, possible loose stools up to 4-6 weeks, also discussed. The patient understands the risks, any and all questions were answered to the patient's satisfaction.  2. Patient has elected to proceed with surgical treatment. Procedure will be scheduled. Written consent was obtained.  3. Hypothyroid -stable. Continue current meds  4. B12 deficiency -stable. CBC normal. Continue current meds  5. Thrombocytopenia -noted on last CBC at 145. No clinical sign of easy bleeding. No need for intervention at this point.  Of the 40 minutes I spent with the patient, 15 minutes were spent discussing pathophysiology, risks, benefits, alternatives to the treatment options  Electronically signed by  Sung Amabile, DO at 07/24/2017 12:50 PM EDT

## 2017-07-27 ENCOUNTER — Other Ambulatory Visit: Payer: Self-pay

## 2017-07-27 ENCOUNTER — Encounter
Admission: RE | Admit: 2017-07-27 | Discharge: 2017-07-27 | Disposition: A | Discharge status: Home / Self Care | Payer: BC Managed Care – PPO | Source: Ambulatory Visit | Attending: Surgery | Admitting: Surgery

## 2017-07-27 DIAGNOSIS — Z01812 Encounter for preprocedural laboratory examination: Secondary | ICD-10-CM | POA: Insufficient documentation

## 2017-07-27 NOTE — Patient Instructions (Signed)
Your procedure is scheduled on: Tues 7/16 Report to Day Surgery. To find out your arrival time please call [phone] between 1PM - 3PM on mon 7/15.  Remember: Instructions that are not followed completely may result in serious medical risk,  up to and including death, or upon the discretion of your surgeon and anesthesiologist your  surgery may need to be rescheduled.     _X__ 1. Do not eat food after midnight the night before your procedure.                 No gum chewing or hard candies. You may drink clear liquids up to 2 hours                 before you are scheduled to arrive for your surgery- DO not drink clear                 liquids within 2 hours of the start of your surgery.                 Clear Liquids include:  water, apple juice without pulp, clear carbohydrate                 drink such as Clearfast of Gatorade, Black Coffee or Tea (Do not add                 anything to coffee or tea).  __X__2.  On the morning of surgery brush your teeth with toothpaste and water, you                may rinse your mouth with mouthwash if you wish.  Do not swallow any toothpaste of mouthwash.     __ 3.  No Alcohol for 24 hours before or after surgery.   ___ 4.  Do Not Smoke or use e-cigarettes For 24 Hours Prior to Your Surgery.                 Do not use any chewable tobacco products for at least 6 hours prior to                 surgery.  ____  5.  Bring all medications with you on the day of surgery if instructed.   _x___  6.  Notify your doctor if there is any change in your medical condition      (cold, fever, infections).     Do not wear jewelry, make-up, hairpins, clips or nail polish. Do not wear lotions, powders, or perfumes. You may wear deodorant. Do not shave 48 hours prior to surgery. Men may shave face and neck. Do not bring valuables to the hospital.    [facility] is not responsible for any belongings or valuables.  Contacts, dentures or  bridgework may not be worn into surgery. Leave your suitcase in the car. After surgery it may be brought to your room. For patients admitted to the hospital, discharge time is determined by your treatment team.   Patients discharged the day of surgery will not be allowed to drive home.   Please read over the following fact sheets that you were given:    _x___ Take these medicines the morning of surgery with A SIP OF WATER:    1.Cyanocobalamin (RA VITAMIN B-12 TR) 1000 MCG TBCR  2. levothyroxine (SYNTHROID, LEVOTHROID) 25 MCG tablet  3.   4.  5.  6.  ____ Fleet Enema (as directed)   __x__ Use CHG  Soap as directed  ____ Use inhalers on the day of surgery  ____ Stop metformin 2 days prior to surgery    ____ Take 1/2 of usual insulin dose the night before surgery. No insulin the morning          of surgery.   ____ Stop Coumadin/Plavix/aspirin on   _x___ Stop Anti-inflammatories ibuprofen (ADVIL,MOTRIN) 200 MG tablet  today    ____ Stop supplements until after surgery.    ____ Bring C-Pap to the hospital.

## 2017-07-31 MED ORDER — CEFAZOLIN SODIUM-DEXTROSE 2-4 GM/100ML-% IV SOLN
2.0000 g | INTRAVENOUS | Status: AC
  Administered 2017-08-01: 2 g via INTRAVENOUS

## 2017-08-01 ENCOUNTER — Other Ambulatory Visit: Payer: Self-pay

## 2017-08-01 ENCOUNTER — Encounter
Admission: RE | Disposition: A | Discharge status: Home / Self Care | Payer: Self-pay | Source: Ambulatory Visit | Attending: Surgery

## 2017-08-01 ENCOUNTER — Ambulatory Visit
Admission: RE | Admit: 2017-08-01 | Discharge: 2017-08-01 | Disposition: A | Discharge status: Home / Self Care | Payer: BC Managed Care – PPO | Source: Ambulatory Visit | Attending: Surgery | Admitting: Surgery

## 2017-08-01 ENCOUNTER — Ambulatory Visit: Payer: Self-pay | Admitting: Surgery

## 2017-08-01 ENCOUNTER — Ambulatory Visit: Payer: BC Managed Care – PPO | Admitting: Anesthesiology

## 2017-08-01 DIAGNOSIS — Z79899 Other long term (current) drug therapy: Secondary | ICD-10-CM | POA: Insufficient documentation

## 2017-08-01 DIAGNOSIS — K8010 Calculus of gallbladder with chronic cholecystitis without obstruction: Secondary | ICD-10-CM | POA: Diagnosis not present

## 2017-08-01 DIAGNOSIS — J309 Allergic rhinitis, unspecified: Secondary | ICD-10-CM | POA: Diagnosis not present

## 2017-08-01 DIAGNOSIS — M1990 Unspecified osteoarthritis, unspecified site: Secondary | ICD-10-CM | POA: Diagnosis not present

## 2017-08-01 DIAGNOSIS — K828 Other specified diseases of gallbladder: Secondary | ICD-10-CM | POA: Diagnosis not present

## 2017-08-01 DIAGNOSIS — Z820 Family history of epilepsy and other diseases of the nervous system: Secondary | ICD-10-CM | POA: Diagnosis not present

## 2017-08-01 DIAGNOSIS — Z8042 Family history of malignant neoplasm of prostate: Secondary | ICD-10-CM | POA: Insufficient documentation

## 2017-08-01 DIAGNOSIS — E7800 Pure hypercholesterolemia, unspecified: Secondary | ICD-10-CM | POA: Diagnosis not present

## 2017-08-01 DIAGNOSIS — E039 Hypothyroidism, unspecified: Secondary | ICD-10-CM | POA: Diagnosis not present

## 2017-08-01 DIAGNOSIS — D696 Thrombocytopenia, unspecified: Secondary | ICD-10-CM | POA: Diagnosis not present

## 2017-08-01 DIAGNOSIS — E538 Deficiency of other specified B group vitamins: Secondary | ICD-10-CM | POA: Diagnosis not present

## 2017-08-01 HISTORY — PX: CHOLECYSTECTOMY: SHX55

## 2017-08-01 SURGERY — LAPAROSCOPIC CHOLECYSTECTOMY
Anesthesia: General | Wound class: Clean Contaminated

## 2017-08-01 MED ORDER — LACTATED RINGERS IV SOLN
INTRAVENOUS | Status: DC | PRN
  Administered 2017-08-01: 07:00:00 via INTRAVENOUS

## 2017-08-01 MED ORDER — MIDAZOLAM HCL 2 MG/2ML IJ SOLN
INTRAMUSCULAR | Status: AC
  Filled 2017-08-01: qty 2

## 2017-08-01 MED ORDER — FENTANYL CITRATE (PF) 250 MCG/5ML IJ SOLN
INTRAMUSCULAR | Status: AC
  Filled 2017-08-01: qty 5

## 2017-08-01 MED ORDER — HYDROMORPHONE HCL 1 MG/ML IJ SOLN
INTRAMUSCULAR | Status: DC | PRN
  Administered 2017-08-01: 0.5 mg via INTRAVENOUS

## 2017-08-01 MED ORDER — ACETAMINOPHEN 500 MG PO TABS: 1000.0000 mg | ORAL_TABLET | ORAL | Status: DC

## 2017-08-01 MED ORDER — CEFAZOLIN SODIUM-DEXTROSE 2-4 GM/100ML-% IV SOLN: 2.0000 g | INTRAVENOUS | Status: DC

## 2017-08-01 MED ORDER — SODIUM CHLORIDE 0.9 % IJ SOLN
INTRAMUSCULAR | Status: AC
  Filled 2017-08-01: qty 50

## 2017-08-01 MED ORDER — PROPOFOL 10 MG/ML IV BOLUS
INTRAVENOUS | Status: AC
  Filled 2017-08-01: qty 20

## 2017-08-01 MED ORDER — DEXAMETHASONE SODIUM PHOSPHATE 10 MG/ML IJ SOLN
INTRAMUSCULAR | Status: DC | PRN
  Administered 2017-08-01: 10 mg via INTRAVENOUS

## 2017-08-01 MED ORDER — BUPIVACAINE HCL (PF) 0.5 % IJ SOLN
INTRAMUSCULAR | Status: AC
  Filled 2017-08-01: qty 30

## 2017-08-01 MED ORDER — ACETAMINOPHEN 500 MG PO TABS
1000.0000 mg | ORAL_TABLET | ORAL | Status: AC
  Administered 2017-08-01: 1000 mg via ORAL

## 2017-08-01 MED ORDER — LACTATED RINGERS IV SOLN
INTRAVENOUS | Status: DC
  Administered 2017-08-01: 07:00:00 via INTRAVENOUS

## 2017-08-01 MED ORDER — CHLORHEXIDINE GLUCONATE CLOTH 2 % EX PADS: 6.0000 | MEDICATED_PAD | Freq: Once | CUTANEOUS | Status: DC

## 2017-08-01 MED ORDER — DOCUSATE SODIUM 100 MG PO CAPS: 100.0000 mg | ORAL_CAPSULE | Freq: Two times a day (BID) | ORAL | 0 refills | Status: AC | PRN

## 2017-08-01 MED ORDER — LIDOCAINE HCL (PF) 2 % IJ SOLN
INTRAMUSCULAR | Status: AC
  Filled 2017-08-01: qty 10

## 2017-08-01 MED ORDER — FENTANYL CITRATE (PF) 100 MCG/2ML IJ SOLN
INTRAMUSCULAR | Status: DC | PRN
  Administered 2017-08-01: 50 ug via INTRAVENOUS
  Administered 2017-08-01: 100 ug via INTRAVENOUS

## 2017-08-01 MED ORDER — HYDROMORPHONE HCL 1 MG/ML IJ SOLN
INTRAMUSCULAR | Status: AC
  Filled 2017-08-01: qty 1

## 2017-08-01 MED ORDER — FAMOTIDINE 20 MG PO TABS
20.0000 mg | ORAL_TABLET | Freq: Once | ORAL | Status: AC
  Administered 2017-08-01: 20 mg via ORAL

## 2017-08-01 MED ORDER — FAMOTIDINE 20 MG PO TABS
ORAL_TABLET | ORAL | Status: AC
  Administered 2017-08-01: 20 mg via ORAL
  Filled 2017-08-01: qty 1

## 2017-08-01 MED ORDER — SUGAMMADEX SODIUM 500 MG/5ML IV SOLN
INTRAVENOUS | Status: DC | PRN
  Administered 2017-08-01: 200 mg via INTRAVENOUS

## 2017-08-01 MED ORDER — KETOROLAC TROMETHAMINE 30 MG/ML IJ SOLN
INTRAMUSCULAR | Status: DC | PRN
  Administered 2017-08-01: 15 mg via INTRAVENOUS

## 2017-08-01 MED ORDER — CEFAZOLIN SODIUM-DEXTROSE 2-4 GM/100ML-% IV SOLN
INTRAVENOUS | Status: AC
  Filled 2017-08-01: qty 100

## 2017-08-01 MED ORDER — ACETAMINOPHEN 500 MG PO TABS
ORAL_TABLET | ORAL | Status: AC
  Administered 2017-08-01: 1000 mg via ORAL
  Filled 2017-08-01: qty 2

## 2017-08-01 MED ORDER — PHENYLEPHRINE HCL 10 MG/ML IJ SOLN
INTRAMUSCULAR | Status: DC | PRN
  Administered 2017-08-01: 100 ug via INTRAVENOUS

## 2017-08-01 MED ORDER — PROPOFOL 10 MG/ML IV BOLUS
INTRAVENOUS | Status: DC | PRN
  Administered 2017-08-01: 100 mg via INTRAVENOUS
  Administered 2017-08-01: 30 mg via INTRAVENOUS

## 2017-08-01 MED ORDER — LIDOCAINE-EPINEPHRINE (PF) 1 %-1:200000 IJ SOLN
INTRAMUSCULAR | Status: DC | PRN
  Administered 2017-08-01: 14 mL via INTRAMUSCULAR

## 2017-08-01 MED ORDER — LIDOCAINE-EPINEPHRINE (PF) 1 %-1:200000 IJ SOLN
INTRAMUSCULAR | Status: AC
  Filled 2017-08-01: qty 30

## 2017-08-01 MED ORDER — MIDAZOLAM HCL 2 MG/2ML IJ SOLN
INTRAMUSCULAR | Status: DC | PRN
  Administered 2017-08-01: 2 mg via INTRAVENOUS

## 2017-08-01 MED ORDER — ONDANSETRON HCL 4 MG/2ML IJ SOLN
INTRAMUSCULAR | Status: DC | PRN
  Administered 2017-08-01: 4 mg via INTRAVENOUS

## 2017-08-01 MED ORDER — LIDOCAINE HCL (CARDIAC) PF 100 MG/5ML IV SOSY
PREFILLED_SYRINGE | INTRAVENOUS | Status: DC | PRN
  Administered 2017-08-01: 80 mg via INTRAVENOUS

## 2017-08-01 MED ORDER — ROCURONIUM BROMIDE 100 MG/10ML IV SOLN
INTRAVENOUS | Status: DC | PRN
  Administered 2017-08-01: 10 mg via INTRAVENOUS
  Administered 2017-08-01: 50 mg via INTRAVENOUS
  Administered 2017-08-01: 10 mg via INTRAVENOUS

## 2017-08-01 MED ORDER — HYDROCODONE-ACETAMINOPHEN 5-325 MG PO TABS: 1.0000 | ORAL_TABLET | Freq: Four times a day (QID) | ORAL | 0 refills | Status: AC | PRN

## 2017-08-01 MED ORDER — FENTANYL CITRATE (PF) 100 MCG/2ML IJ SOLN: 25.0000 ug | INTRAMUSCULAR | Status: DC | PRN

## 2017-08-01 SURGICAL SUPPLY — 60 items
APPLICATOR COTTON TIP 6 STRL (MISCELLANEOUS) IMPLANT
APPLICATOR COTTON TIP 6IN STRL (MISCELLANEOUS)
APPLIER CLIP 5 13 M/L LIGAMAX5 (MISCELLANEOUS) ×2
BLADE SURG 11 STRL SS SAFETY (MISCELLANEOUS) ×2 IMPLANT
BLADE SURG 15 STRL LF DISP TIS (BLADE) IMPLANT
BLADE SURG 15 STRL SS (BLADE)
CANISTER SUCT 1200ML W/VALVE (MISCELLANEOUS) ×2 IMPLANT
CHLORAPREP W/TINT 26ML (MISCELLANEOUS) ×2 IMPLANT
CHOLANGIOGRAM CATH TAUT (CATHETERS) IMPLANT
CLEANER CAUTERY TIP 5X5 PAD (MISCELLANEOUS) IMPLANT
CLIP APPLIE 5 13 M/L LIGAMAX5 (MISCELLANEOUS) ×1 IMPLANT
DECANTER SPIKE VIAL GLASS SM (MISCELLANEOUS) ×4 IMPLANT
DEFOGGER SCOPE WARMER CLEARIFY (MISCELLANEOUS) ×2 IMPLANT
DERMABOND ADVANCED (GAUZE/BANDAGES/DRESSINGS) ×1
DERMABOND ADVANCED .7 DNX12 (GAUZE/BANDAGES/DRESSINGS) ×1 IMPLANT
DISSECTOR BLUNT TIP ENDO 5MM (MISCELLANEOUS) ×2 IMPLANT
DRAPE C-ARM XRAY 36X54 (DRAPES) IMPLANT
DRAPE SHEET LG 3/4 BI-LAMINATE (DRAPES) IMPLANT
ELECT CAUTERY BLADE 6.4 (BLADE) IMPLANT
ELECT REM PT RETURN 9FT ADLT (ELECTROSURGICAL) ×2
ELECTRODE REM PT RTRN 9FT ADLT (ELECTROSURGICAL) ×1 IMPLANT
GLOVE BIO SURGEON STRL SZ 6.5 (GLOVE) ×2 IMPLANT
GLOVE BIOGEL PI IND STRL 6.5 (GLOVE) ×1 IMPLANT
GLOVE BIOGEL PI IND STRL 7.0 (GLOVE) ×1 IMPLANT
GLOVE BIOGEL PI INDICATOR 6.5 (GLOVE) ×1
GLOVE BIOGEL PI INDICATOR 7.0 (GLOVE) ×1
GLOVE SURG SYN 6.5 ES PF (GLOVE) ×4 IMPLANT
GOWN STRL REUS W/ TWL LRG LVL3 (GOWN DISPOSABLE) ×3 IMPLANT
GOWN STRL REUS W/TWL LRG LVL3 (GOWN DISPOSABLE) ×3
IRRIGATION STRYKERFLOW (MISCELLANEOUS) ×1 IMPLANT
IRRIGATOR STRYKERFLOW (MISCELLANEOUS) ×2
IV CATH ANGIO 12GX3 LT BLUE (NEEDLE) IMPLANT
IV NS 1000ML (IV SOLUTION) ×1
IV NS 1000ML BAXH (IV SOLUTION) ×1 IMPLANT
JACKSON PRATT 10 (INSTRUMENTS) IMPLANT
L-HOOK LAP DISP 36CM (ELECTROSURGICAL) ×2
LHOOK LAP DISP 36CM (ELECTROSURGICAL) ×1 IMPLANT
NEEDLE HYPO 22GX1.5 SAFETY (NEEDLE) ×2 IMPLANT
PACK LAP CHOLECYSTECTOMY (MISCELLANEOUS) ×2 IMPLANT
PAD CLEANER CAUTERY TIP 5X5 (MISCELLANEOUS)
PENCIL ELECTRO HAND CTR (MISCELLANEOUS) ×2 IMPLANT
POUCH SPECIMEN RETRIEVAL 10MM (ENDOMECHANICALS) ×2 IMPLANT
SCISSORS METZENBAUM CVD 33 (INSTRUMENTS) ×2 IMPLANT
SLEEVE ENDOPATH XCEL 5M (ENDOMECHANICALS) ×4 IMPLANT
SOL ANTI-FOG 6CC FOG-OUT (MISCELLANEOUS) IMPLANT
SOL FOG-OUT ANTI-FOG 6CC (MISCELLANEOUS)
SPONGE LAP 18X18 RF (DISPOSABLE) IMPLANT
STOPCOCK 4 WAY LG BORE MALE ST (IV SETS) IMPLANT
SUT MNCRL 4-0 (SUTURE) ×1
SUT MNCRL 4-0 27XMFL (SUTURE) ×1
SUT VIC AB 3-0 SH 27 (SUTURE) ×1
SUT VIC AB 3-0 SH 27X BRD (SUTURE) ×1 IMPLANT
SUT VICRYL 0 AB UR-6 (SUTURE) ×4 IMPLANT
SUTURE MNCRL 4-0 27XMF (SUTURE) ×1 IMPLANT
SYR 20CC LL (SYRINGE) IMPLANT
TOWEL OR 17X26 4PK STRL BLUE (TOWEL DISPOSABLE) ×2 IMPLANT
TROCAR XCEL BLUNT TIP 100MML (ENDOMECHANICALS) ×2 IMPLANT
TROCAR XCEL NON-BLD 5MMX100MML (ENDOMECHANICALS) ×2 IMPLANT
TUBING INSUFFLATION (TUBING) ×2 IMPLANT
WATER STERILE IRR 1000ML POUR (IV SOLUTION) ×2 IMPLANT

## 2017-08-01 NOTE — Anesthesia Preprocedure Evaluation (Addendum)
Anesthesia Evaluation  Patient identified by MRN, date of birth, ID band Patient awake    Reviewed: Allergy & Precautions, H&P , NPO status , Patient's Chart, lab work & pertinent test results  Airway Mallampati: III  TM Distance: >3 FB Neck ROM: full    Dental   Pulmonary neg pulmonary ROS, neg COPD,           Cardiovascular (-) Past MI, (-) Cardiac Stents, (-) CABG and (-) CHF negative cardio ROS       Neuro/Psych negative neurological ROS  negative psych ROS   GI/Hepatic negative GI ROS, Neg liver ROS,   Endo/Other  negative endocrine ROSHypothyroidism   Renal/GU      Musculoskeletal  (+) Arthritis ,   Abdominal   Peds  Hematology negative hematology ROS (+)   Anesthesia Other Findings Past Medical History: No date: Arthritis No date: Blood dyscrasia     Comment:  thrombocytopenia No date: Deficiency of other specified B group vitamins (CODE)     Comment:  b 12 No date: Hypothyroidism  Past Surgical History: 12/02/2015: CHONDROPLASTY; Left     Comment:  Procedure: CHONDROPLASTY;  Surgeon: Donato HeinzJames P Hooten, MD;               Location: [facility] ORS;  Service: Orthopedics;  Laterality:               Left; No date: COLONSCOPY 12/02/2015: KNEE ARTHROSCOPY WITH MEDIAL MENISECTOMY; Left     Comment:  Procedure: KNEE ARTHROSCOPY WITH MEDIAL MENISECTOMY;                Surgeon: Donato HeinzJames P Hooten, MD;  Location: [facility] ORS;                Service: Orthopedics;  Laterality: Left; No date: TONSILLECTOMY  BMI    Body Mass Index:  28.56 kg/m      Reproductive/Obstetrics negative OB ROS                           Anesthesia Physical Anesthesia Plan  ASA: II  Anesthesia Plan: General ETT   Post-op Pain Management:    Induction:   PONV Risk Score and Plan: Ondansetron and Dexamethasone  Airway Management Planned: Oral ETT  Additional Equipment:   Intra-op Plan:   Post-operative Plan:  Extubation in OR  Informed Consent: I have reviewed the patients History and Physical, chart, labs and discussed the procedure including the risks, benefits and alternatives for the proposed anesthesia with the patient or authorized representative who has indicated his/her understanding and acceptance.   Dental Advisory Given  Plan Discussed with: Anesthesiologist, CRNA and Surgeon  Anesthesia Plan Comments:        Anesthesia Quick Evaluation

## 2017-08-01 NOTE — Anesthesia Procedure Notes (Signed)
Procedure Name: Intubation Date/Time: 08/01/2017 7:36 AM Performed by: Justus Memory, CRNA Pre-anesthesia Checklist: Patient identified, Patient being monitored, Timeout performed, Emergency Drugs available and Suction available Patient Re-evaluated:Patient Re-evaluated prior to induction Oxygen Delivery Method: Circle system utilized Preoxygenation: Pre-oxygenation with 100% oxygen Induction Type: IV induction Ventilation: Mask ventilation without difficulty Laryngoscope Size: Mac and 3 Grade View: Grade I Tube type: Oral Tube size: 7.0 mm Number of attempts: 1 Airway Equipment and Method: Stylet Placement Confirmation: ETT inserted through vocal cords under direct vision,  positive ETCO2 and breath sounds checked- equal and bilateral Secured at: 21 cm Tube secured with: Tape Dental Injury: Teeth and Oropharynx as per pre-operative assessment

## 2017-08-01 NOTE — Progress Notes (Signed)
CC: chronic cholecystitis  HPI: Justin Lara is a 62 y.o. male who was referred by PA whitaker for evaluation of Chronic cholecystitis. Symptoms were first noted 2 weeks ago. Symptoms did not start at work. Pain is sharp, intermittent started at night and woke him up from sleep. Lasted couple hours and resolved on own. Another similar episode happened a week ago at which point workup started and revealed possible chronic cholecystitis on US so referred to us. Exacerbated by nothing specific. Alleviated by nothing specific. Cannot make any associations with food, specific body positions/movements.   Past Medical History: has a past medical history of Allergic rhinitis, B12 deficiency (01/27/2015), Chicken pox, Chronic osteoarthritis (01/23/2015), OA (osteoarthritis), Pure hypercholesterolemia (01/21/2014), and Thrombocytopenia (CMS-HCC) (01/23/2015). Past Surgical History: has a past surgical history that includes Tonsillectomy (1973); Colonoscopy (04/29/2005); Colonoscopy (03/31/2010); Colonoscopy (03/25/2015); and Left knee arthroscopy, partial medial meniscetomy and chondroplasty (12/02/2015).  Family History: family history includes Parkinsonism in his mother; Prostate cancer in his father.   Social History: reports that he has never smoked. He has never used smokeless tobacco. He reports that he does not drink alcohol or use drugs.   Current Medications: has a current medication list which includes the following prescription(s): cyanocobalamin and levothyroxine.  Allergies:  No Known Allergies  ROS: A comprehensive review of systems was asked and was negative.   Objective:   BP 106/70  Pulse 61  Temp 36.6 C (97.8 F) (Oral)  Ht 183 cm (6' 0.05")  Wt 97.5 kg (215 lb)  BMI 29.12 kg/m   Constitutional : alert, appears stated age and cooperative  Throat: no asymmetry, masses, or scars  Respiratory: clear to auscultation bilaterally  Cardiovascular: regular rate and rhythm, S1, S2 normal,  no murmur, click, rub or gallop and regular rate and rhythm  Gastrointestinal: soft, non-tender; bowel sounds normal; no masses, no organomegaly. Negative Murphy's sign  Musculoskeletal: Steady gait and movement  Skin: Cool and moist, no surgical scars  Psychiatric: Normal affect, non-agitated, not confused    LABS:  - Lab Results  Component Value Date  WBC 7.6 07/17/2017  HGB 15.4 07/17/2017  HCT 44.9 07/17/2017  PLT 145 (L) 07/17/2017   - Lab Results  Component Value Date  NA 143 07/17/2017  K 3.8 07/17/2017  CL 107 07/17/2017  CO2 30.0 07/17/2017  BUN 20 07/17/2017  CREATININE 1.1 07/17/2017  CALCIUM 9.2 07/17/2017  ALB 4.1 07/17/2017  TBILI 0.4 07/17/2017  ALKPHOS 63 07/17/2017  AST 15 07/17/2017  ALT 13 07/17/2017  GLUCOSE 74 07/17/2017  GFR 67 07/17/2017    RADS: Procedure Name Priority Date/Time Associated Diagnosis Comments  US ABDOMEN LIMITED RUQ STAT 07/18/2017 11:44 AM EDT RUQ abdominal pain Results for this procedure are in the results section.  Imaging Results - documented in this encounter  US ABDOMEN LIMITED RUQ (07/18/2017 11:44 AM EDT) US ABDOMEN LIMITED RUQ (07/18/2017 11:44 AM EDT)  Narrative Performed At  CLINICAL DATA:RUQ abdominal pain for the past 3 days    EXAM:  ULTRASOUND ABDOMEN LIMITED RIGHT UPPER QUADRANT    COMPARISON:None.    FINDINGS:  Gallbladder:    The gallbladder is adequately distended. Stones measuring up to 1.6  cm are present. There is thickening of the gallbladder wall to 8.3  mm and there is pericholecystic fluid. There is no positive  sonographic Murphy's sign.    Common bile duct:    Diameter: 5.3 mm    Liver:    The hepatic echotexture is normal. There are multiple hepatic  cysts.  The largest lies in the right lobe and measures 2.8 x 2.2 x 1.3 cm.  There is no intrahepatic ductal dilation. The portal vein is patent  with normal flow direction toward the liver.     IMPRESSION:  Gallstones with findings that likely reflect subacute or chronic  cholecystitis.    Simple appearing hepatic cysts.    Incidental note is made of a large cyst in the upper pole of the  right kidney containing a few internal echoes. A dedicated renal  ultrasound is recommended.      Electronically Signed  By: Linton Rump M.D.  On: 07/18/2017 11:51     Assessment:   Chronic cholecystitis Hypothyroid B12 deficiency thrombocytopenia  Plan:    1. Alternatives include continued observation. Benefits include possible symptom relief, prevention of complications including acute cholecystitis, pancreatitis. Discussed the risk of surgery including post-op infxn, seroma, biloma, chronic pain, poor-delayed wound healing, retained gallstone, conversion to open procedure, post-op SBO or ileus, and need for additional procedures to address said risks. The risks of general anesthetic including MI, CVA, sudden death or even reaction to anesthetic medications also discussed.  Typical post operative recovery of 3-5 days rest, continued pain in area and incision sites, possible loose stools up to 4-6 weeks, also discussed. The patient understands the risks, any and all questions were answered to the patient's satisfaction.  2. Patient has elected to proceed with surgical treatment. Procedure will be scheduled. Written consent was obtained.  3. Hypothyroid -stable. Continue current meds  4. B12 deficiency -stable. CBC normal. Continue current meds  5. Thrombocytopenia -noted on last CBC at 145. No clinical sign of easy bleeding. No need for intervention at this point.   Addendum:  Pt seen on 08/01/17.  No changes to H&P above

## 2017-08-01 NOTE — Anesthesia Postprocedure Evaluation (Signed)
Anesthesia Post Note  Patient: Justin Lara  Procedure(s) Performed: LAPAROSCOPIC CHOLECYSTECTOMY (N/A )  Patient location during evaluation: PACU Anesthesia Type: General Level of consciousness: awake and alert Pain management: pain level controlled Vital Signs Assessment: post-procedure vital signs reviewed and stable Respiratory status: spontaneous breathing, nonlabored ventilation and respiratory function stable Cardiovascular status: blood pressure returned to baseline and stable Postop Assessment: no apparent nausea or vomiting Anesthetic complications: no     Last Vitals:  Vitals:   08/01/17 1016 08/01/17 1030  BP: 129/81 105/75  Pulse: (!) 52 (!) 57  Resp: 14 16  Temp: (!) 36.1 C (!) 36.3 C  SpO2: 91% 97%    Last Pain:  Vitals:   08/01/17 1030  TempSrc: Temporal  PainSc: 1                  Jovita GammaKathryn L Fitzgerald

## 2017-08-01 NOTE — Discharge Instructions (Signed)

## 2017-08-01 NOTE — Transfer of Care (Signed)
Immediate Anesthesia Transfer of Care Note  Patient: Justin Lara  Procedure(s) Performed: LAPAROSCOPIC CHOLECYSTECTOMY (N/A )  Patient Location: PACU  Anesthesia Type:General  Level of Consciousness: sedated  Airway & Oxygen Therapy: Patient Spontanous Breathing and Patient connected to face mask oxygen  Post-op Assessment: Report given to RN and Post -op Vital signs reviewed and stable  Post vital signs: Reviewed and stable  Last Vitals:  Vitals Value Taken Time  BP 121/78 08/01/2017  9:33 AM  Temp 36.1 C 08/01/2017  9:33 AM  Pulse 58 08/01/2017  9:37 AM  Resp 16 08/01/2017  9:37 AM  SpO2 97 % 08/01/2017  9:37 AM  Vitals shown include unvalidated device data.  Last Pain:  Vitals:   08/01/17 0631  TempSrc: Tympanic  PainSc: 0-No pain         Complications: No apparent anesthesia complications

## 2017-08-01 NOTE — Brief Op Note (Signed)
08/01/2017  9:35 AM  PATIENT:  Justin Lara  62 y.o. male  PRE-OPERATIVE DIAGNOSIS:  chronic cholecystitis  POST-OPERATIVE DIAGNOSIS:  chronic cholecystitis  PROCEDURE:  Procedure(s): LAPAROSCOPIC CHOLECYSTECTOMY (N/A)  SURGEON:  Surgeon(s) and Role:    Tonna Boehringer* Aavya Shafer, DO - Primary  PHYSICIAN ASSISTANT: N/A  ASSISTANTS: none   ANESTHESIA:   general  EBL:  15 mL   BLOOD ADMINISTERED:none  DRAINS: none   LOCAL MEDICATIONS USED:  MARCAINE  /LIDOCAINE  SPECIMEN:  Source of Specimen:  GALLBLADDER  DISPOSITION OF SPECIMEN:  PATHOLOGY  COUNTS:  YES  TOURNIQUET:  * No tourniquets in log *  DICTATION: .Note written in EPIC  PLAN OF CARE: Discharge to home after PACU  PATIENT DISPOSITION:  PACU - hemodynamically stable.   Delay start of Pharmacological VTE agent (>24hrs) due to surgical blood loss or risk of bleeding: no

## 2017-08-01 NOTE — Anesthesia Post-op Follow-up Note (Signed)
Anesthesia QCDR form completed.        

## 2017-08-01 NOTE — Op Note (Signed)
Preoperative diagnosis:  chronic cholecystitis  Postoperative diagnosis: same.  Procedure: Laparoscopic Cholecystectomy.   Anesthesia: GETA   Surgeon: Sung AmabileIsami Verley Pariseau  Specimen: Gallbladder  Complications: None  EBL: 15mL  Wound Classification: Clean Contaminated  Indications: see HPI  Findings: Critical view of safety noted Cystic duct and artery identified, ligated and divided, clips remained intact at end of procedure Adequate hemostasis  Description of procedure: The patient was placed on the operating table in the supine position. SCDs placed, pre-op abx administered.  General anesthesia was induced and OG tube placed by anesthesia. A time-out was completed verifying correct patient, procedure, site, positioning, and implant(s) and/or special equipment prior to beginning this procedure. The abdomen was prepped and draped in the usual sterile fashion.  An incision was made in a natural skin line under the umbilicus.  Dissection carried down to fascia where two 0 vicryl sutures placed to use as anchor sutures for hasson port.  Incision made into fascia and blunt dissection used to enter peritoneum.  Hasson port placed and insufflation started up to 15mm Hg without any dramatic increase in pressure.    The laparoscope was inserted and the abdomen inspected. No injuries from initial trocar placement were noted. Additional trocars were then inserted under direct visualization in the following locations: a 5-mm trocar in the subxyphoid region and two 5-mm trocars along the right costal margin. The abdomen was inspected and no abnormalities or injuries during  were found. The table was placed in the reverse Trendelenburg position with the right side up.  Filmy adhesions between the gallbladder and omentum, duodenum and transverse colon were lysed sharply. The dome of the gallbladder was grasped with an atraumatic grasper passed through the lateral port and retracted over the dome of the liver.  The infundibulum was also grasped with an atraumatic grasper and retracted toward the right lower quadrant. This maneuver exposed Calot's triangle. The peritoneum overlying the gallbladder infundibulum was then dissected and the cystic duct and cystic artery identified.  Critical view of safety with the liver bed clearly visible behind the duct and artery with no additional structures noted.  Picture taken before the cystic duct and cystic artery clipped and divided close to the gallbladder.  The gallbladder was then dissected from its peritoneal and liver bed attachments by electrocautery. Hemostasis was checked and the gallbladder were removed using an endoscopic retrieval bag placed through the umbilical port. The gallbladder was passed off the table as a specimen. The gallbladder fossa was copiously irrigated with saline and any leaked bile was suctioned out, and hemostasis was obtained. There was no evidence of bleeding from the gallbladder fossa or cystic artery or leakage of the bile from the cystic duct stump. Abdomen desufflated and secondary trocars were removed under direct vision. No bleeding was noted. The laparoscope was withdrawn and the umbilical trocar removed.  The fascia of the Hasson trocar site was closed with figure-of-eight 0 vicryl sutures.  All skin incisions  were closed with subcuticular sutures of 4-0 monocryl and dressed with topical skin adhesive. The orogastric tube was removed and patient extubated. The patient tolerated the procedure well and was taken to the postanesthesia care unit in stable condition.  All sponge and instrument count correct at end of procedure.

## 2017-08-03 LAB — SURGICAL PATHOLOGY

## 2018-11-08 HISTORY — PX: CYSTOURETHROSCOPY: SHX476

## 2018-11-15 ENCOUNTER — Encounter
Admission: RE | Disposition: A | Discharge status: Home Health | Payer: Self-pay | Source: Home / Self Care | Attending: Urology

## 2018-11-15 ENCOUNTER — Encounter: Payer: Self-pay | Admitting: *Deleted

## 2018-11-15 ENCOUNTER — Other Ambulatory Visit: Payer: Self-pay

## 2018-11-15 ENCOUNTER — Ambulatory Visit
Admission: RE | Admit: 2018-11-15 | Discharge: 2018-11-15 | Disposition: A | Discharge status: Home Health | Payer: BC Managed Care – PPO | Attending: Urology | Admitting: Urology

## 2018-11-15 DIAGNOSIS — Z79890 Hormone replacement therapy (postmenopausal): Secondary | ICD-10-CM | POA: Insufficient documentation

## 2018-11-15 DIAGNOSIS — N200 Calculus of kidney: Secondary | ICD-10-CM | POA: Diagnosis not present

## 2018-11-15 DIAGNOSIS — E039 Hypothyroidism, unspecified: Secondary | ICD-10-CM | POA: Insufficient documentation

## 2018-11-15 DIAGNOSIS — Z79899 Other long term (current) drug therapy: Secondary | ICD-10-CM | POA: Insufficient documentation

## 2018-11-15 HISTORY — PX: EXTRACORPOREAL SHOCK WAVE LITHOTRIPSY: SHX1557

## 2018-11-15 LAB — PROTIME-INR
INR: 1 (ref 0.8–1.2)
Prothrombin Time: 13.4 seconds (ref 11.4–15.2)

## 2018-11-15 LAB — PLATELET COUNT: Platelets: 98 10*3/uL — ABNORMAL LOW (ref 150–400)

## 2018-11-15 SURGERY — LITHOTRIPSY, ESWL
Anesthesia: Moderate Sedation | Laterality: Left

## 2018-11-15 MED ORDER — LEVOFLOXACIN 500 MG PO TABS
500.0000 mg | ORAL_TABLET | Freq: Once | ORAL | Status: AC
  Administered 2018-11-15: 13:00:00 500 mg via ORAL

## 2018-11-15 MED ORDER — DEXTROSE-NACL 5-0.45 % IV SOLN: INTRAVENOUS | Status: DC

## 2018-11-15 MED ORDER — FUROSEMIDE 10 MG/ML IJ SOLN
INTRAMUSCULAR | Status: AC
  Filled 2018-11-15: qty 2

## 2018-11-15 MED ORDER — FUROSEMIDE 10 MG/ML IJ SOLN
10.0000 mg | Freq: Once | INTRAMUSCULAR | Status: AC
  Administered 2018-11-15: 16:00:00 10 mg via INTRAVENOUS

## 2018-11-15 MED ORDER — ONDANSETRON 8 MG PO TBDP: 8.0000 mg | ORAL_TABLET | Freq: Four times a day (QID) | ORAL | 3 refills | Status: DC | PRN

## 2018-11-15 MED ORDER — DIPHENHYDRAMINE HCL 25 MG PO CAPS
ORAL_CAPSULE | ORAL | Status: AC
  Filled 2018-11-15: qty 1

## 2018-11-15 MED ORDER — MIDAZOLAM HCL 2 MG/2ML IJ SOLN
INTRAMUSCULAR | Status: AC
  Filled 2018-11-15: qty 2

## 2018-11-15 MED ORDER — ACETAMINOPHEN-CODEINE #3 300-30 MG PO TABS: 1.0000 | ORAL_TABLET | ORAL | 0 refills | Status: DC | PRN

## 2018-11-15 MED ORDER — URIBEL 118 MG PO CAPS: 1.0000 | ORAL_CAPSULE | Freq: Four times a day (QID) | ORAL | 3 refills | Status: DC | PRN

## 2018-11-15 MED ORDER — MORPHINE SULFATE (PF) 10 MG/ML IV SOLN
10.0000 mg | Freq: Once | INTRAVENOUS | Status: AC
  Administered 2018-11-15: 10 mg via INTRAMUSCULAR

## 2018-11-15 MED ORDER — MIDAZOLAM HCL 2 MG/2ML IJ SOLN
1.0000 mg | Freq: Once | INTRAMUSCULAR | Status: AC
  Administered 2018-11-15: 1 mg via INTRAMUSCULAR

## 2018-11-15 MED ORDER — PROMETHAZINE HCL 25 MG/ML IJ SOLN
INTRAMUSCULAR | Status: AC
  Filled 2018-11-15: qty 1

## 2018-11-15 MED ORDER — MORPHINE SULFATE (PF) 10 MG/ML IV SOLN
INTRAVENOUS | Status: DC
Start: 2018-11-15 — End: 2018-11-15
  Filled 2018-11-15: qty 1

## 2018-11-15 MED ORDER — LEVOFLOXACIN 500 MG PO TABS
ORAL_TABLET | ORAL | Status: AC
  Filled 2018-11-15: qty 1

## 2018-11-15 MED ORDER — PROMETHAZINE HCL 25 MG/ML IJ SOLN
25.0000 mg | Freq: Once | INTRAMUSCULAR | Status: AC
  Administered 2018-11-15: 25 mg via INTRAMUSCULAR

## 2018-11-15 MED ORDER — DIPHENHYDRAMINE HCL 25 MG PO CAPS
25.0000 mg | ORAL_CAPSULE | Freq: Once | ORAL | Status: AC
Start: 2018-11-15 — End: 2018-11-15
  Administered 2018-11-15: 13:00:00 25 mg via ORAL

## 2018-11-15 NOTE — Discharge Instructions (Addendum)
Lithotripsy  Lithotripsy is a treatment that can sometimes help eliminate kidney stones and the pain that they cause. A form of lithotripsy, also known as extracorporeal shock wave lithotripsy, is a nonsurgical procedure that crushes a kidney stone with shock waves. These shock waves pass through your body and focus on the kidney stone. They cause the kidney stone to break up while it is still in the urinary tract. This makes it easier for the smaller pieces of stone to pass in the urine. Tell a health care provider about:  Any allergies you have.  All medicines you are taking, including vitamins, herbs, eye drops, creams, and over-the-counter medicines.  Any blood disorders you have.  Any surgeries you have had.  Any medical conditions you have.  Whether you are pregnant or may be pregnant.  Any problems you or family members have had with anesthetic medicines. What are the risks? Generally, this is a safe procedure. However, problems may occur, including:  Infection.  Bleeding of the kidney.  Bruising of the kidney or skin.  Scarring of the kidney, which can lead to: ? Increased blood pressure. ? Poor kidney function. ? Return (recurrence) of kidney stones.  Damage to other structures or organs, such as the liver, colon, spleen, or pancreas.  Blockage (obstruction) of the the tube that carries urine from the kidney to the bladder (ureter).  Failure of the kidney stone to break into pieces (fragments). What happens before the procedure? Staying hydrated Follow instructions from your health care provider about hydration, which may include:  Up to 2 hours before the procedure - you may continue to drink clear liquids, such as water, clear fruit juice, black coffee, and plain tea. Eating and drinking restrictions Follow instructions from your health care provider about eating and drinking, which may include:  8 hours before the procedure - stop eating heavy meals or foods  such as meat, fried foods, or fatty foods.  6 hours before the procedure - stop eating light meals or foods, such as toast or cereal.  6 hours before the procedure - stop drinking milk or drinks that contain milk.  2 hours before the procedure - stop drinking clear liquids. General instructions  Plan to have someone take you home from the hospital or clinic.  Ask your health care provider about: ? Changing or stopping your regular medicines. This is especially important if you are taking diabetes medicines or blood thinners. ? Taking medicines such as aspirin and ibuprofen. These medicines and other NSAIDs can thin your blood. Do not take these medicines for 7 days before your procedure if your health care provider instructs you not to.  You may have tests, such as: ? Blood tests. ? Urine tests. ? Imaging tests, such as a CT scan. What happens during the procedure?  To lower your risk of infection: ? Your health care team will wash or sanitize their hands. ? Your skin will be washed with soap.  An IV tube will be inserted into one of your veins. This tube will give you fluids and medicines.  You will be given one or more of the following: ? A medicine to help you relax (sedative). ? A medicine to make you fall asleep (general anesthetic).  A water-filled cushion may be placed behind your kidney or on your abdomen. In some cases you may be placed in a tub of lukewarm water.  Your body will be positioned in a way that makes it easy to target the kidney  stone.  A flexible tube with holes in it (stent) may be placed in the ureter. This will help keep urine flowing from the kidney if the fragments of the stone have been blocking the ureter.  An X-ray or ultrasound exam will be done to locate your stone.  Shock waves will be aimed at the stone. If you are awake, you may feel a tapping sensation as the shock waves pass through your body. The procedure may vary among health care  providers and hospitals. What happens after the procedure?  You may have an X-ray to see whether the procedure was able to break up the kidney stone and how much of the stone has passed. If large stone fragments remain after treatment, you may need to have a second procedure at a later time.  Your blood pressure, heart rate, breathing rate, and blood oxygen level will be monitored until the medicines you were given have worn off.  You may be given antibiotics or pain medicine as needed.  If a stent was placed in your ureter during surgery, it may stay in place for a few weeks.  You may need strain your urine to collect pieces of the kidney stone for testing.  You will need to drink plenty of water.  Do not drive for 24 hours if you were given a sedative. Summary  Lithotripsy is a treatment that can sometimes help eliminate kidney stones and the pain that they cause.  A form of lithotripsy, also known as extracorporeal shock wave lithotripsy, is a nonsurgical procedure that crushes a kidney stone with shock waves.  Generally, this is a safe procedure. However, problems may occur, including damage to the kidney or other organs, infection, or obstruction of the tube that carries urine from the kidney to the bladder (ureter).  When you go home, you will need to drink plenty of water. You may be asked to strain your urine to collect pieces of the kidney stone for testing. This information is not intended to replace advice given to you by your health care provider. Make sure you discuss any questions you have with your health care provider. Document Released: 01/01/2000 Document Revised: 04/16/2018 Document Reviewed: 11/25/2015 Elsevier Patient Education  2020 [employer]. Lithotripsy, Care After This sheet gives you information about how to care for yourself after your procedure. Your health care provider may also give you more specific instructions. If you have problems or questions,  contact your health care provider. What can I expect after the procedure? After the procedure, it is common to have:  Some blood in your urine. This should only last for a few days.  Soreness in your back, sides, or upper abdomen for a few days.  Blotches or bruises on your back where the pressure wave entered the skin.  Pain, discomfort, or nausea when pieces (fragments) of the kidney stone move through the tube that carries urine from the kidney to the bladder (ureter). Stone fragments may pass soon after the procedure, but they may continue to pass for up to 4-8 weeks. ? If you have severe pain or nausea, contact your health care provider. This may be caused by a large stone that was not broken up, and this may mean that you need more treatment.  Some pain or discomfort during urination.  Some pain or discomfort in the lower abdomen or (in men) at the base of the penis. Follow these instructions at home: Medicines  Take over-the-counter and prescription medicines only as told  by your health care provider.  If you were prescribed an antibiotic medicine, take it as told by your health care provider. Do not stop taking the antibiotic even if you start to feel better.  Do not drive for 24 hours if you were given a medicine to help you relax (sedative).  Do not drive or use heavy machinery while taking prescription pain medicine. Eating and drinking      Drink enough water and fluids to keep your urine clear or pale yellow. This helps any remaining pieces of the stone to pass. It can also help prevent new stones from forming.  Eat plenty of fresh fruits and vegetables.  Follow instructions from your health care provider about eating and drinking restrictions. You may be instructed: ? To reduce how much salt (sodium) you eat or drink. Check ingredients and nutrition facts on packaged foods and beverages. ? To reduce how much meat you eat.  Eat the recommended amount of calcium for  your age and gender. Ask your health care provider how much calcium you should have. General instructions  Get plenty of rest.  Most people can resume normal activities 1-2 days after the procedure. Ask your health care provider what activities are safe for you.  Your health care provider may direct you to lie in a certain position (postural drainage) and tap firmly (percuss) over your kidney area to help stone fragments pass. Follow instructions as told by your health care provider.  If directed, strain all urine through the strainer that was provided by your health care provider. ? Keep all fragments for your health care provider to see. Any stones that are found may be sent to a medical lab for examination. The stone may be as small as a grain of salt.  Keep all follow-up visits as told by your health care provider. This is important. Contact a health care provider if:  You have pain that is severe or does not get better with medicine.  You have nausea that is severe or does not go away.  You have blood in your urine longer than your health care provider told you to expect.  You have more blood in your urine.  You have pain during urination that does not go away.  You urinate more frequently than usual and this does not go away.  You develop a rash or any other possible signs of an allergic reaction. Get help right away if:  You have severe pain in your back, sides, or upper abdomen.  You have severe pain while urinating.  Your urine is very dark red.  You have blood in your stool (feces).  You cannot pass any urine at all.  You feel a strong urge to urinate after emptying your bladder.  You have a fever or chills.  You develop shortness of breath, difficulty breathing, or chest pain.  You have severe nausea that leads to persistent vomiting.  You faint. Summary  After this procedure, it is common to have some pain, discomfort, or nausea when pieces (fragments)  of the kidney stone move through the tube that carries urine from the kidney to the bladder (ureter). If this pain or nausea is severe, however, you should contact your health care provider.  Most people can resume normal activities 1-2 days after the procedure. Ask your health care provider what activities are safe for you.  Drink enough water and fluids to keep your urine clear or pale yellow. This helps any remaining pieces of  the stone to pass, and it can help prevent new stones from forming.  If directed, strain your urine and keep all fragments for your health care provider to see. Fragments or stones may be as small as a grain of salt.  Get help right away if you have severe pain in your back, sides, or upper abdomen or have severe pain while urinating. This information is not intended to replace advice given to you by your health care provider. Make sure you discuss any questions you have with your health care provider. Document Released: 01/23/2007 Document Revised: 04/16/2018 Document Reviewed: 11/25/2015 Elsevier Patient Education  2020 [employer]   AMBULATORY SURGERY  DISCHARGE INSTRUCTIONS   1) The drugs that you were given will stay in your system until tomorrow so for the next 24 hours you should not:  A) Drive an automobile B) Make any legal decisions C) Drink any alcoholic beverage   2) You may resume regular meals tomorrow.  Today it is better to start with liquids and gradually work up to solid foods.  You may eat anything you prefer, but it is better to start with liquids, then soup and crackers, and gradually work up to solid foods.   3) Please notify your doctor immediately if you have any unusual bleeding, trouble breathing, redness and pain at the surgery site, drainage, fever, or pain not relieved by medication.    4) Additional Instructions:  FOLLOW [facility] DISCHARGE INSTRUCTION SHEET AS REVIEWED.        Please contact your physician with  any problems or Same Day Surgery at [phone], Monday through Friday 6 am to 4 pm, or [place] at [facility] Main number at [phone].

## 2018-11-16 ENCOUNTER — Encounter: Payer: Self-pay | Admitting: Urology

## 2020-07-15 ENCOUNTER — Other Ambulatory Visit: Payer: Self-pay | Admitting: Urology

## 2020-07-15 DIAGNOSIS — R972 Elevated prostate specific antigen [PSA]: Secondary | ICD-10-CM

## 2020-07-27 ENCOUNTER — Other Ambulatory Visit: Payer: Self-pay

## 2020-07-27 ENCOUNTER — Ambulatory Visit
Admission: RE | Admit: 2020-07-27 | Discharge: 2020-07-27 | Disposition: A | Discharge status: Home / Self Care | Payer: BC Managed Care – PPO | Source: Ambulatory Visit | Attending: Urology | Admitting: Urology

## 2020-07-27 DIAGNOSIS — R972 Elevated prostate specific antigen [PSA]: Secondary | ICD-10-CM | POA: Diagnosis not present

## 2020-07-27 MED ORDER — GADOBUTROL 1 MMOL/ML IV SOLN
10.0000 mL | Freq: Once | INTRAVENOUS | Status: AC | PRN
  Administered 2020-07-27: 10 mL via INTRAVENOUS

## 2020-08-12 NOTE — H&P (Signed)
NAMEKARIN, GRIFFITH MEDICAL RECORD NO: [id number] ACCOUNT NO: [id number] DATE OF BIRTH: February 10, 1955 PHYSICIAN: Suszanne Conners. Evelene Croon, MD  History and Physical   DATE OF ADMISSION: 08/20/2020  Same day surgery 08/20/2020.  CHIEF COMPLAINT:  Elevated PSA and abnormal prostate gland MRI scan.  HISTORY OF PRESENT ILLNESS:  The patient is a 65 year old white male with an elevated PSA of 4.7 ng/mL.  This was evaluated with an exosome IntelliScore that was elevated at 84.96.  Prostate MRI scan indicated a 48.4 mL prostate with a PI-RAD category 4  lesion measuring 13 mm at the right apical peripheral zone.  The patient comes in now for a UroNav fusion biopsy of the prostate.  PAST MEDICAL HISTORY: ALLERGIES:  No drug allergies.  CURRENT MEDICATIONS:  Include levothyroxine and vitamin B12.  PAST SURGICAL HISTORY:  1.  Tonsillectomy in 1973. 2.  Meatotomy and cystoscopy 2012. 3.  Cholecystectomy 2019. 4.  Lithotripsy 2020.  PAST AND CURRENT MEDICAL CONDITIONS: 1.  Hypothyroidism. 2.  Meatal stenosis. 3.  Kidney stones.  REVIEW OF SYSTEMS:  The patient denies chest pain, shortness of breath, diabetes, stroke or heart disease.  PHYSICAL EXAMINATION: VITAL SIGNS:  Height 6 feet 0 inches, weight 215 pounds, BMI 29. GENERAL:  Well-nourished white male in no acute distress. HEENT:  Sclerae were clear.  Pupils are round, reactive to light and accommodation.  Extraocular movements are intact. NECK:  No palpable masses or tenderness.  No carotid bruits. PULMONARY:  Lungs are clear to auscultation. CARDIOVASCULAR:  Regular rhythm and rate without audible murmurs. LYMPHATIC:  No palpable cervical or inguinal adenopathy. ABDOMEN:  Soft and nontender abdomen.  No CVA tenderness. GENITOURINARY:  Circumcised.  Testes are both smooth, nontender 20 mL size each. RECTAL:  25 grams, smooth, nontender prostate. NEUROMUSCULAR:  Alert and oriented x3.  IMPRESSION:  1.  Elevated PSA. 2.  PI-RAD  category 4 lesion of the right apical region.  PLAN:  UroNav fusion, biopsy of the prostate.   PUS D: 08/11/2020 1:31:07 pm T: 08/11/2020 1:59:00 pm  JOB: [id number]/ [id number]

## 2020-08-14 ENCOUNTER — Encounter
Admission: RE | Admit: 2020-08-14 | Discharge: 2020-08-14 | Disposition: A | Discharge status: Home / Self Care | Payer: BC Managed Care – PPO | Source: Ambulatory Visit | Attending: Urology | Admitting: Urology

## 2020-08-14 ENCOUNTER — Other Ambulatory Visit: Payer: Self-pay

## 2020-08-14 DIAGNOSIS — Z01810 Encounter for preprocedural cardiovascular examination: Secondary | ICD-10-CM | POA: Insufficient documentation

## 2020-08-14 HISTORY — DX: Personal history of urinary calculi: Z87.442

## 2020-08-14 NOTE — Patient Instructions (Addendum)
Your procedure is scheduled on: Thursday, August 4 Report to the Registration Desk on the 1st floor of the [facility] To find out your arrival time, please call [phone] between 1PM - 3PM on: Wednesday, August 3  REMEMBER: Instructions that are not followed completely may result in serious medical risk, up to and including death; or upon the discretion of your surgeon and anesthesiologist your surgery may need to be rescheduled.  Do not eat food after midnight the night before surgery.  No gum chewing, lozengers or hard candies.  You may however, drink CLEAR liquids up to 2 hours before you are scheduled to arrive for your surgery. Do not drink anything within 2 hours of your scheduled arrival time.  Clear liquids include: - water  - apple juice without pulp - gatorade (not RED, PURPLE, OR BLUE) - black coffee or tea (Do NOT add milk or creamers to the coffee or tea) Do NOT drink anything that is not on this list.  TAKE THESE MEDICATIONS THE MORNING OF SURGERY WITH A SIP OF WATER:  Levothyroxine  One week prior to surgery: starting July 28 Stop Anti-inflammatories (NSAIDS) such as Advil, Aleve, Ibuprofen, Motrin, Naproxen, Naprosyn and Aspirin based products such as Excedrin, Goodys Powder, BC Powder. Stop ANY OVER THE COUNTER supplements until after surgery. You may however, continue to take Tylenol if needed for pain up until the day of surgery.  No Alcohol for 24 hours before or after surgery.  No Smoking including e-cigarettes for 24 hours prior to surgery.  No chewable tobacco products for at least 6 hours prior to surgery.  No nicotine patches on the day of surgery.  Do not use any "recreational" drugs for at least a week prior to your surgery.  Please be advised that the combination of cocaine and anesthesia may have negative outcomes, up to and including death. If you test positive for cocaine, your surgery will be cancelled.  On the morning of surgery brush  your teeth with toothpaste and water, you may rinse your mouth with mouthwash if you wish. Do not swallow any toothpaste or mouthwash.  Do not wear jewelry, make-up, hairpins, clips or nail polish.  Do not wear lotions, powders, or perfumes.   Do not shave body from the neck down 48 hours prior to surgery just in case you cut yourself which could leave a site for infection.   Contact lenses, hearing aids and dentures may not be worn into surgery.  Do not bring valuables to the hospital. [facility] is not responsible for any missing/lost belongings or valuables.   Fleets enema the morning of surgery as directed.  Notify your doctor if there is any change in your medical condition (cold, fever, infection).  Wear comfortable clothing (specific to your surgery type) to the hospital.  After surgery, you can help prevent lung complications by doing breathing exercises.  Take deep breaths and cough every 1-2 hours. Your doctor may order a device called an Incentive Spirometer to help you take deep breaths.  If you are being discharged the day of surgery, you will not be allowed to drive home. You will need a responsible adult (18 years or older) to drive you home and stay with you that night.   If you are taking public transportation, you will need to have a responsible adult (18 years or older) with you. Please confirm with your physician that it is acceptable to use public transportation.   Please call the Pre-admissions Testing Dept.  at [phone] if you have any questions about these instructions.  Surgery Visitation Policy:  Patients undergoing a surgery or procedure may have one family member or support person with them as long as that person is not COVID-19 positive or experiencing its symptoms.  That person may remain in the waiting area during the procedure.

## 2020-08-20 ENCOUNTER — Other Ambulatory Visit: Payer: Self-pay

## 2020-08-20 ENCOUNTER — Ambulatory Visit: Payer: BC Managed Care – PPO | Admitting: Anesthesiology

## 2020-08-20 ENCOUNTER — Encounter: Payer: Self-pay | Admitting: Urology

## 2020-08-20 ENCOUNTER — Encounter
Admission: RE | Disposition: A | Discharge status: Home / Self Care | Payer: Self-pay | Source: Home / Self Care | Attending: Urology

## 2020-08-20 ENCOUNTER — Ambulatory Visit
Admission: RE | Admit: 2020-08-20 | Discharge: 2020-08-20 | Disposition: A | Discharge status: Home / Self Care | Payer: BC Managed Care – PPO | Attending: Urology | Admitting: Urology

## 2020-08-20 DIAGNOSIS — Z87442 Personal history of urinary calculi: Secondary | ICD-10-CM | POA: Insufficient documentation

## 2020-08-20 DIAGNOSIS — Z9049 Acquired absence of other specified parts of digestive tract: Secondary | ICD-10-CM | POA: Insufficient documentation

## 2020-08-20 DIAGNOSIS — E039 Hypothyroidism, unspecified: Secondary | ICD-10-CM | POA: Diagnosis not present

## 2020-08-20 DIAGNOSIS — N400 Enlarged prostate without lower urinary tract symptoms: Secondary | ICD-10-CM | POA: Diagnosis not present

## 2020-08-20 DIAGNOSIS — N419 Inflammatory disease of prostate, unspecified: Secondary | ICD-10-CM | POA: Diagnosis not present

## 2020-08-20 DIAGNOSIS — R972 Elevated prostate specific antigen [PSA]: Secondary | ICD-10-CM | POA: Diagnosis present

## 2020-08-20 HISTORY — PX: PROSTATE BIOPSY: SHX241

## 2020-08-20 SURGERY — BIOPSY, PROSTATE
Anesthesia: General

## 2020-08-20 MED ORDER — EPHEDRINE 5 MG/ML INJ
INTRAVENOUS | Status: AC
  Filled 2020-08-20: qty 5

## 2020-08-20 MED ORDER — DEXAMETHASONE SODIUM PHOSPHATE 10 MG/ML IJ SOLN
INTRAMUSCULAR | Status: AC
  Filled 2020-08-20: qty 1

## 2020-08-20 MED ORDER — OXYCODONE HCL 5 MG PO TABS: 5.0000 mg | ORAL_TABLET | Freq: Once | ORAL | Status: DC | PRN

## 2020-08-20 MED ORDER — CHLORHEXIDINE GLUCONATE 0.12 % MT SOLN
OROMUCOSAL | Status: AC
  Administered 2020-08-20: 15 mL via OROMUCOSAL
  Filled 2020-08-20: qty 15

## 2020-08-20 MED ORDER — GENTAMICIN IN SALINE 1.6-0.9 MG/ML-% IV SOLN
80.0000 mg | INTRAVENOUS | Status: AC
  Administered 2020-08-20: 80 mg via INTRAVENOUS
  Filled 2020-08-20: qty 50

## 2020-08-20 MED ORDER — ORAL CARE MOUTH RINSE: 15.0000 mL | Freq: Once | OROMUCOSAL | Status: AC

## 2020-08-20 MED ORDER — MIDAZOLAM HCL 2 MG/2ML IJ SOLN
INTRAMUSCULAR | Status: DC | PRN
  Administered 2020-08-20: 2 mg via INTRAVENOUS

## 2020-08-20 MED ORDER — LACTATED RINGERS IV SOLN: INTRAVENOUS | Status: DC

## 2020-08-20 MED ORDER — LEVOFLOXACIN 500 MG PO TABS: 500.0000 mg | ORAL_TABLET | Freq: Every day | ORAL | 1 refills | Status: DC

## 2020-08-20 MED ORDER — PROPOFOL 10 MG/ML IV BOLUS
INTRAVENOUS | Status: AC
  Filled 2020-08-20: qty 20

## 2020-08-20 MED ORDER — DEXAMETHASONE SODIUM PHOSPHATE 10 MG/ML IJ SOLN
INTRAMUSCULAR | Status: DC | PRN
  Administered 2020-08-20: 10 mg via INTRAVENOUS

## 2020-08-20 MED ORDER — LIDOCAINE HCL (CARDIAC) PF 100 MG/5ML IV SOSY
PREFILLED_SYRINGE | INTRAVENOUS | Status: DC | PRN
  Administered 2020-08-20: 80 mg via INTRAVENOUS

## 2020-08-20 MED ORDER — GENTAMICIN SULFATE 40 MG/ML IJ SOLN
80.0000 mg | Freq: Once | INTRAVENOUS | Status: DC
  Filled 2020-08-20: qty 2

## 2020-08-20 MED ORDER — ONDANSETRON HCL 4 MG/2ML IJ SOLN
INTRAMUSCULAR | Status: AC
  Filled 2020-08-20: qty 2

## 2020-08-20 MED ORDER — FENTANYL CITRATE (PF) 100 MCG/2ML IJ SOLN
INTRAMUSCULAR | Status: AC
  Filled 2020-08-20: qty 2

## 2020-08-20 MED ORDER — ONDANSETRON HCL 4 MG/2ML IJ SOLN
INTRAMUSCULAR | Status: DC | PRN
  Administered 2020-08-20: 4 mg via INTRAVENOUS

## 2020-08-20 MED ORDER — FAMOTIDINE 20 MG PO TABS: 20.0000 mg | ORAL_TABLET | Freq: Once | ORAL | Status: AC

## 2020-08-20 MED ORDER — MIDAZOLAM HCL 2 MG/2ML IJ SOLN
INTRAMUSCULAR | Status: AC
  Filled 2020-08-20: qty 2

## 2020-08-20 MED ORDER — FAMOTIDINE 20 MG PO TABS
ORAL_TABLET | ORAL | Status: AC
  Administered 2020-08-20: 20 mg via ORAL
  Filled 2020-08-20: qty 1

## 2020-08-20 MED ORDER — SUCCINYLCHOLINE CHLORIDE 200 MG/10ML IV SOSY
PREFILLED_SYRINGE | INTRAVENOUS | Status: AC
  Filled 2020-08-20: qty 10

## 2020-08-20 MED ORDER — FLEET ENEMA 7-19 GM/118ML RE ENEM: 1.0000 | ENEMA | Freq: Once | RECTAL | Status: DC

## 2020-08-20 MED ORDER — SUCCINYLCHOLINE CHLORIDE 200 MG/10ML IV SOSY
PREFILLED_SYRINGE | INTRAVENOUS | Status: DC | PRN
  Administered 2020-08-20: 140 mg via INTRAVENOUS

## 2020-08-20 MED ORDER — CHLORHEXIDINE GLUCONATE 0.12 % MT SOLN: 15.0000 mL | Freq: Once | OROMUCOSAL | Status: AC

## 2020-08-20 MED ORDER — PROPOFOL 10 MG/ML IV BOLUS
INTRAVENOUS | Status: DC | PRN
Start: 2020-08-20 — End: 2020-08-20
  Administered 2020-08-20: 200 mg via INTRAVENOUS

## 2020-08-20 MED ORDER — FENTANYL CITRATE (PF) 100 MCG/2ML IJ SOLN
INTRAMUSCULAR | Status: DC | PRN
  Administered 2020-08-20: 100 ug via INTRAVENOUS

## 2020-08-20 MED ORDER — HYDROMORPHONE HCL 1 MG/ML IJ SOLN: 0.2500 mg | INTRAMUSCULAR | Status: DC | PRN

## 2020-08-20 MED ORDER — CEFAZOLIN SODIUM-DEXTROSE 1-4 GM/50ML-% IV SOLN
1.0000 g | Freq: Once | INTRAVENOUS | Status: AC
  Administered 2020-08-20: 1 g via INTRAVENOUS

## 2020-08-20 MED ORDER — MEPERIDINE HCL 25 MG/ML IJ SOLN: 6.2500 mg | INTRAMUSCULAR | Status: DC | PRN

## 2020-08-20 MED ORDER — ACETAMINOPHEN 160 MG/5ML PO SOLN
325.0000 mg | ORAL | Status: DC | PRN
  Filled 2020-08-20: qty 20.3

## 2020-08-20 MED ORDER — ACETAMINOPHEN 10 MG/ML IV SOLN: 1000.0000 mg | Freq: Once | INTRAVENOUS | Status: DC | PRN

## 2020-08-20 MED ORDER — PROMETHAZINE HCL 25 MG/ML IJ SOLN: 12.5000 mg | Freq: Once | INTRAMUSCULAR | Status: DC | PRN

## 2020-08-20 MED ORDER — ACETAMINOPHEN 325 MG PO TABS: 325.0000 mg | ORAL_TABLET | ORAL | Status: DC | PRN

## 2020-08-20 MED ORDER — OXYCODONE HCL 5 MG/5ML PO SOLN
5.0000 mg | Freq: Once | ORAL | Status: DC | PRN
Start: 2020-08-20 — End: 2020-08-20

## 2020-08-20 MED ORDER — CEFAZOLIN SODIUM-DEXTROSE 1-4 GM/50ML-% IV SOLN
INTRAVENOUS | Status: AC
  Filled 2020-08-20: qty 50

## 2020-08-20 SURGICAL SUPPLY — 20 items
COVER MAYO STAND REUSABLE (DRAPES) ×2 IMPLANT
COVER TRANSDUCER ULTRASOUND (MISCELLANEOUS) ×1 IMPLANT
FEE DELIVERY LASER CO2 FORTEC (MISCELLANEOUS) IMPLANT
GLOVE SURG ENC MOIS LTX SZ7 (GLOVE) ×4 IMPLANT
GUIDE NDL ENDOCAV 16-18 CVR (NEEDLE) IMPLANT
GUIDE NEEDLE ENDOCAV 16-18 CVR (NEEDLE) IMPLANT
INST BIOPSY MAXCORE 18GX25 (NEEDLE) ×2 IMPLANT
LASER CO2 FORTEC DELIVERY FEE (MISCELLANEOUS) ×2 IMPLANT
LASER XPS ACCESS DROP OFF FEE (MISCELLANEOUS) ×1 IMPLANT
MANIFOLD NEPTUNE II (INSTRUMENTS) ×2 IMPLANT
NDL GUIDE BIOPSY 644068 (NEEDLE) IMPLANT
NEEDLE GUIDE BIOPSY 644068 (NEEDLE) IMPLANT
PROBE BIOSP ALOKA ALPHA6 PROST (MISCELLANEOUS) ×1 IMPLANT
PROBE URONAV BK 8808E 8818 HLD (MISCELLANEOUS) IMPLANT
STRAP SAFETY 5IN WIDE (MISCELLANEOUS) ×2 IMPLANT
SURGILUBE 2OZ TUBE FLIPTOP (MISCELLANEOUS) ×2 IMPLANT
TOWEL OR 17X26 4PK STRL BLUE (TOWEL DISPOSABLE) ×2 IMPLANT
URONAV BK 8808E 8818 PROBE HLD (MISCELLANEOUS) ×2
URONAV MRI FUSION TWO PATIENTS (MISCELLANEOUS) ×1 IMPLANT
URONAV ULTRASOUND (MISCELLANEOUS) ×1 IMPLANT

## 2020-08-20 NOTE — H&P (Signed)
Date of Initial H&P: 08/12/20  History reviewed, patient examined, no change in status, stable for surgery.

## 2020-08-20 NOTE — Transfer of Care (Signed)
Immediate Anesthesia Transfer of Care Note  Patient: Justin Lara  Procedure(s) Performed: PROSTATE BIOPSY URONAV  Patient Location: PACU  Anesthesia Type:General  Level of Consciousness: awake and alert   Airway & Oxygen Therapy: Patient Spontanous Breathing and Patient connected to face mask oxygen  Post-op Assessment: Report given to RN and Post -op Vital signs reviewed and stable  Post vital signs: Reviewed and stable  Last Vitals:  Vitals Value Taken Time  BP    Temp    Pulse    Resp    SpO2      Last Pain:  Vitals:   08/20/20 0749  TempSrc: Temporal  PainSc: 0-No pain         Complications: No notable events documented.

## 2020-08-20 NOTE — Anesthesia Preprocedure Evaluation (Addendum)
Anesthesia Evaluation  Patient identified by MRN, date of birth, ID band Patient awake    Reviewed: Allergy & Precautions, NPO status , Patient's Chart, lab work & pertinent test results, reviewed documented beta blocker date and time   History of Anesthesia Complications Negative for: history of anesthetic complications (no family hx)  Airway Mallampati: II  TM Distance: >3 FB Neck ROM: Full    Dental   Pulmonary neg pulmonary ROS,    Pulmonary exam normal        Cardiovascular Exercise Tolerance: Good negative cardio ROS Normal cardiovascular exam     Neuro/Psych negative neurological ROS  negative psych ROS   GI/Hepatic negative GI ROS, Neg liver ROS,   Endo/Other  Hypothyroidism   Renal/GU negative Renal ROS  negative genitourinary   Musculoskeletal  (+) Arthritis ,   Abdominal   Peds  Hematology  (+) Blood dyscrasia (hx of thrombocytopenia), ,   Anesthesia Other Findings EKG 08/14/20 brady sinus  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       Plan Discussed with: CRNA  Anesthesia Plan Comments:         Anesthesia Quick Evaluation

## 2020-08-20 NOTE — Anesthesia Procedure Notes (Signed)
Procedure Name: Intubation Date/Time: 08/20/2020 9:09 AM Performed by: Berniece Pap, CRNA Pre-anesthesia Checklist: Patient identified, Emergency Drugs available, Suction available and Patient being monitored Patient Re-evaluated:Patient Re-evaluated prior to induction Oxygen Delivery Method: Circle system utilized Preoxygenation: Pre-oxygenation with 100% oxygen Induction Type: IV induction Ventilation: Mask ventilation without difficulty Laryngoscope Size: McGraph and 4 Grade View: Grade I Tube type: Oral Tube size: 7.5 mm Number of attempts: 1 Airway Equipment and Method: Stylet and Video-laryngoscopy Placement Confirmation: ETT inserted through vocal cords under direct vision, positive ETCO2 and breath sounds checked- equal and bilateral Secured at: 22 cm Tube secured with: Tape Dental Injury: Teeth and Oropharynx as per pre-operative assessment

## 2020-08-20 NOTE — Op Note (Signed)
Preoperative diagnosis: 1.  Elevated PSA (R97.2)                                           2.  Abnormal prostate gland MRI scan (D40.0)  Postoperative diagnosis: Same  Procedure: Uronav transrectal fusion ultrasound-guided needle biopsy of the prostate gland (CPT 55700, [phone])  Surgeon: Suszanne Conners. Evelene Croon MD  Anesthesia: General  Indications:See the history and physical. After informed consent the above procedure(s) were requested     Technique and findings: After adequate general anesthesia been obtained patient was placed into left lateral decubitus position and digital rectal exam performed.  The rectal vault was clear.  Ultrasound probe was placed and ultrasound images acquired.  The ultrasound images were then fused with the MRI images and the right apical PIRAD category 4 lesion was identified.  3 core biopsies were obtained here.  At this point standard 12 core systematic biopsies were performed.  The ultrasound probe was removed.  Blood loss was minimal.  Procedure was then terminated and patient transferred to the recovery room in stable condition.

## 2020-08-20 NOTE — Discharge Instructions (Addendum)
Transrectal Ultrasound-Guided Prostate Biopsy, Care After This sheet gives you information about how to care for yourself after your procedure. Your doctor may also give you more specific instructions. If you have problems or questions, contact your doctor. What can I expect after the procedure? After the procedure, it is common to have: Pain and discomfort in your butt, especially while sitting. Pink-colored pee (urine), due to small amounts of blood in the pee. Burning while peeing (urinating). Blood in your poop (stool). Bleeding from your butt. Blood in your semen. Follow these instructions at home: Medicines Take over-the-counter and prescription medicines only as told by your doctor. If you were prescribed antibiotic medicine, take it as told by your doctor. Do not stop taking the antibiotic even if you start to feel better. Activity Do not drive for 24 hours if you were given a medicine to help you relax (sedative) during your procedure. Return to your normal activities as told by your doctor. Ask your doctor what activities are safe for you. Ask your doctor when it is okay for you to have sex. Do not lift anything that is heavier than 10 lb (4.5 kg), or the limit that you are told, until your doctor says that it is safe. General instructions Drink enough water to keep your pee pale yellow. Watch your pee, poop, and semen for new bleeding or bleeding that gets worse. Keep all follow-up visits as told by your doctor. This is important. Contact a doctor if you: Have blood clots in your pee or poop. Notice that your pee smells bad or unusual. Have very bad belly pain. Have trouble peeing. Notice that your lower belly feels firm. Have blood in your pee for more than 2 weeks after the procedure. Have blood in your semen for more than 2 months after the procedure. Have problems getting an erection. Feel sick to your stomach (nauseous) or throw up (vomit). Have new or worse bleeding  in your pee, poop, or semen. Get help right away if you: Have a fever or chills. Have bright red pee. Have very bad pain that does not get better with medicine. Cannot pee. Summary After this procedure, it is common to have pain and discomfort around your butt, especially while sitting. You may have blood in your pee and poop. It is common to have blood in your semen for 1-2 months. If you were prescribed antibiotic medicine, take it as told by your doctor. Do not stop taking the antibiotic even if you start to feel better. Get help right away if you have a fever or chills. This information is not intended to replace advice given to you by your health care provider. Make sure you discuss any questions you have with your health care provider. Document Revised: 11/18/2019 Document Reviewed: 09/19/2019 Elsevier Patient Education  2022 Elsevier Inc.     AMBULATORY SURGERY  DISCHARGE INSTRUCTIONS   The drugs that you were given will stay in your system until tomorrow so for the next 24 hours you should not:  Drive an automobile Make any legal decisions Drink any alcoholic beverage   You may resume regular meals tomorrow.  Today it is better to start with liquids and gradually work up to solid foods.  You may eat anything you prefer, but it is better to start with liquids, then soup and crackers, and gradually work up to solid foods.   Please notify your doctor immediately if you have any unusual bleeding, trouble breathing, redness and pain at the   surgery site, drainage, fever, or pain not relieved by medication.    Additional Instructions:     Please contact your physician with any problems or Same Day Surgery at [phone], Monday through Friday 6 am to 4 pm, or [place] at [place] [facility] number at [phone].  

## 2020-08-20 NOTE — Anesthesia Postprocedure Evaluation (Signed)
Anesthesia Post Note  Patient: Justin Lara  Procedure(s) Performed: PROSTATE BIOPSY URONAV  Patient location during evaluation: PACU Anesthesia Type: General Level of consciousness: awake and alert Pain management: pain level controlled Vital Signs Assessment: post-procedure vital signs reviewed and stable Respiratory status: spontaneous breathing, nonlabored ventilation, respiratory function stable and patient connected to nasal cannula oxygen Cardiovascular status: blood pressure returned to baseline and stable Postop Assessment: no apparent nausea or vomiting Anesthetic complications: no   No notable events documented.   Last Vitals:  Vitals:   08/20/20 1013 08/20/20 1022  BP: 114/77 126/78  Pulse:    Resp:  14  Temp: (!) 36.1 C (!) 36.1 C  SpO2:  96%    Last Pain:  Vitals:   08/20/20 1022  TempSrc: Temporal  PainSc: 0-No pain                 Kenneth Cuaresma M Yuval Rubens

## 2020-08-21 LAB — SURGICAL PATHOLOGY

## 2020-09-23 ENCOUNTER — Encounter: Payer: Self-pay | Admitting: Urology

## 2020-11-06 ENCOUNTER — Encounter: Payer: Self-pay | Admitting: Urology

## 2020-11-11 DIAGNOSIS — R972 Elevated prostate specific antigen [PSA]: Secondary | ICD-10-CM | POA: Insufficient documentation

## 2021-05-23 DIAGNOSIS — M1712 Unilateral primary osteoarthritis, left knee: Secondary | ICD-10-CM | POA: Insufficient documentation

## 2021-07-12 ENCOUNTER — Encounter: Payer: Self-pay | Admitting: Urology

## 2021-12-21 ENCOUNTER — Other Ambulatory Visit: Payer: Self-pay

## 2021-12-21 ENCOUNTER — Encounter: Payer: Self-pay | Admitting: Urgent Care

## 2021-12-21 ENCOUNTER — Encounter
Admission: RE | Admit: 2021-12-21 | Discharge: 2021-12-21 | Disposition: A | Discharge status: Home / Self Care | Payer: BC Managed Care – PPO | Source: Ambulatory Visit | Attending: Orthopedic Surgery | Admitting: Orthopedic Surgery

## 2021-12-21 VITALS — BP 148/84 | Resp 16 | Ht 72.0 in | Wt 211.2 lb

## 2021-12-21 DIAGNOSIS — E538 Deficiency of other specified B group vitamins: Secondary | ICD-10-CM

## 2021-12-21 DIAGNOSIS — Z01810 Encounter for preprocedural cardiovascular examination: Secondary | ICD-10-CM

## 2021-12-21 DIAGNOSIS — D696 Thrombocytopenia, unspecified: Secondary | ICD-10-CM

## 2021-12-21 DIAGNOSIS — R8290 Unspecified abnormal findings in urine: Secondary | ICD-10-CM | POA: Diagnosis not present

## 2021-12-21 DIAGNOSIS — E039 Hypothyroidism, unspecified: Secondary | ICD-10-CM | POA: Diagnosis not present

## 2021-12-21 DIAGNOSIS — Z01818 Encounter for other preprocedural examination: Secondary | ICD-10-CM | POA: Insufficient documentation

## 2021-12-21 DIAGNOSIS — Z01812 Encounter for preprocedural laboratory examination: Secondary | ICD-10-CM

## 2021-12-21 DIAGNOSIS — M1712 Unilateral primary osteoarthritis, left knee: Secondary | ICD-10-CM | POA: Diagnosis not present

## 2021-12-21 LAB — COMPREHENSIVE METABOLIC PANEL
ALT: 26 U/L (ref 0–44)
AST: 25 U/L (ref 15–41)
Albumin: 4.1 g/dL (ref 3.5–5.0)
Alkaline Phosphatase: 88 U/L (ref 38–126)
Anion gap: 7 (ref 5–15)
BUN: 19 mg/dL (ref 8–23)
CO2: 27 mmol/L (ref 22–32)
Calcium: 9.4 mg/dL (ref 8.9–10.3)
Chloride: 106 mmol/L (ref 98–111)
Creatinine, Ser: 1.07 mg/dL (ref 0.61–1.24)
GFR, Estimated: >60 mL/min (ref >60)
Glucose, Bld: 81 mg/dL (ref 70–99)
Potassium: 4.3 mmol/L (ref 3.5–5.1)
Sodium: 140 mmol/L (ref 135–145)
Total Bilirubin: 1 mg/dL (ref 0.3–1.2)
Total Protein: 7.6 g/dL (ref 6.5–8.1)

## 2021-12-21 LAB — URINALYSIS, ROUTINE W REFLEX MICROSCOPIC
Bilirubin Urine: NEGATIVE
Glucose, UA: NEGATIVE mg/dL
Hgb urine dipstick: NEGATIVE
Ketones, ur: NEGATIVE mg/dL
Nitrite: NEGATIVE
Protein, ur: NEGATIVE mg/dL
Specific Gravity, Urine: 1.017 (ref 1.005–1.030)
pH: 6 (ref 5.0–8.0)

## 2021-12-21 LAB — SURGICAL PCR SCREEN
MRSA, PCR: NEGATIVE
Staphylococcus aureus: NEGATIVE

## 2021-12-21 LAB — CBC
HCT: 48.9 % (ref 39.0–52.0)
Hemoglobin: 16.6 g/dL (ref 13.0–17.0)
MCH: 29.7 pg (ref 26.0–34.0)
MCHC: 33.9 g/dL (ref 30.0–36.0)
MCV: 87.5 fL (ref 80.0–100.0)
Platelets: 161 10*3/uL (ref 150–400)
RBC: 5.59 MIL/uL (ref 4.22–5.81)
RDW: 12.5 % (ref 11.5–15.5)
WBC: 5.1 10*3/uL (ref 4.0–10.5)
nRBC: 0 % (ref 0.0–0.2)

## 2021-12-21 LAB — TYPE AND SCREEN
ABO/RH(D): A POS
Antibody Screen: NEGATIVE

## 2021-12-21 LAB — SEDIMENTATION RATE: Sed Rate: 2 mm/hr (ref 0–16)

## 2021-12-21 LAB — C-REACTIVE PROTEIN: CRP: <0.5 mg/dL (ref <1.0)

## 2021-12-21 NOTE — Discharge Instructions (Signed)
Instructions after Total Knee Replacement   Naylani Bradner P. Satchel Heidinger, Jr., M.D.     Dept. of Orthopaedics & Sports Medicine  [facility]  [address]  [place], [place]  [address]  Phone: [phone]   Fax: [phone]    DIET: Drink plenty of non-alcoholic fluids. Resume your normal diet. Include foods high in fiber.  ACTIVITY:  You may use crutches or a walker with weight-bearing as tolerated, unless instructed otherwise. You may be weaned off of the walker or crutches by your Physical Therapist.  Do NOT place pillows under the knee. Anything placed under the knee could limit your ability to straighten the knee.   Continue doing gentle exercises. Exercising will reduce the pain and swelling, increase motion, and prevent muscle weakness.   Please continue to use the TED compression stockings for 6 weeks. You may remove the stockings at night, but should reapply them in the morning. Do not drive or operate any equipment until instructed.  WOUND CARE:  Continue to use the PolarCare or ice packs periodically to reduce pain and swelling. You may bathe or shower after the staples are removed at the first office visit following surgery.  MEDICATIONS: You may resume your regular medications. Please take the pain medication as prescribed on the medication. Do not take pain medication on an empty stomach. You have been given a prescription for a blood thinner (Lovenox or Coumadin). Please take the medication as instructed. (NOTE: After completing a 2 week course of Lovenox, take one Enteric-coated aspirin once a day. This along with elevation will help reduce the possibility of phlebitis in your operated leg.) Do not drive or drink alcoholic beverages when taking pain medications.  CALL THE OFFICE FOR: Temperature above 101 degrees Excessive bleeding or drainage on the dressing. Excessive swelling, coldness, or paleness of the toes. Persistent nausea and vomiting.  FOLLOW-UP:  You  should have an appointment to return to the office in 10-14 days after surgery. Arrangements have been made for continuation of Physical Therapy (either home therapy or outpatient therapy).   [facility] Department Directory         [email or web address]       [email or web address]          Cardiology  Appointments: [place] - [phone] [place] - [phone]  Endocrinology  Appointments: [place] - [phone] [place] - [phone]  Gastroenterology  Appointments: [place] - [phone] [place] - [phone]        General Surgery   Appointments: [place] - [phone]  Internal Medicine/Family Medicine  Appointments: [place] - [phone] [place] - [phone] [place] - [phone]  Metabolic and Weigh Loss Surgery  Appointments: [place] - [phone]        Neurology  Appointments: [place] - [phone] [place] - [phone]  Neurosurgery  Appointments: [place] - [phone]  Obstetrics & Gynecology  Appointments: [place] - [phone] [place] - [phone]        Pediatrics  Appointments: [place] - [phone] [place] - [phone]  Physiatry  Appointments: [place] -[phone]  Physical Therapy  Appointments: [place] - [phone] [place] - [phone]        Podiatry  Appointments: [place] - [phone] [place] - [phone]  Pulmonology  Appointments: [place] - [phone]  Rheumatology  Appointments: [place] - [phone]        [place] Location: [facility]  [address] , [place]  [address]  [place] Location: [facility] [address] [place], [place]  [address]  [place] Location: [facility] [address] [place], [place]  [address]    

## 2021-12-21 NOTE — Patient Instructions (Addendum)
Your procedure is scheduled on: 12/29/21 - Wednesday Report to the Registration Desk on the 1st floor of the [facility]. To find out your arrival time, please call [phone] between 1PM - 3PM on: 12/28/21 - Tuesday If your arrival time is 6:00 am, do not arrive prior to that time as the [facility] entrance doors do not open until 6:00 am.  REMEMBER: Instructions that are not followed completely may result in serious medical risk, up to and including death; or upon the discretion of your surgeon and anesthesiologist your surgery may need to be rescheduled.  Do not eat food after midnight the night before surgery.  No gum chewing, lozengers or hard candies.  You may however, drink CLEAR liquids up to 2 hours before you are scheduled to arrive for your surgery. Do not drink anything within 2 hours of your scheduled arrival time.  Clear liquids include: - water  - apple juice without pulp - gatorade (not RED colors) - black coffee or tea (Do NOT add milk or creamers to the coffee or tea) Do NOT drink anything that is not on this list.  TAKE THESE MEDICATIONS THE MORNING OF SURGERY WITH A SIP OF WATER:  - levothyroxine (SYNTHROID, LEVOTHROID)   One week prior to surgery: Stop Anti-inflammatories (NSAIDS) such as Advil, Aleve, Ibuprofen, Motrin, Naproxen, Naprosyn and Aspirin based products such as Excedrin, Goodys Powder, BC Powder.  Stop ANY OVER THE COUNTER supplements until after surgery hold beginning 12/22/21.  You may however, continue to take Tylenol if needed for pain up until the day of surgery.  No Alcohol for 24 hours before or after surgery.  No Smoking including e-cigarettes for 24 hours prior to surgery.  No chewable tobacco products for at least 6 hours prior to surgery.  No nicotine patches on the day of surgery.  Do not use any "recreational" drugs for at least a week prior to your surgery.  Please be advised that the combination of cocaine and anesthesia  may have negative outcomes, up to and including death. If you test positive for cocaine, your surgery will be cancelled.  On the morning of surgery brush your teeth with toothpaste and water, you may rinse your mouth with mouthwash if you wish. Do not swallow any toothpaste or mouthwash.  Use CHG Soap or wipes as directed on instruction sheet.  Do not wear jewelry, make-up, hairpins, clips or nail polish.  Do not wear lotions, powders, or perfumes.   Do not shave body from the neck down 48 hours prior to surgery just in case you cut yourself which could leave a site for infection.  Also, freshly shaved skin may become irritated if using the CHG soap.  Contact lenses, hearing aids and dentures may not be worn into surgery.  Do not bring valuables to the hospital. [facility] is not responsible for any missing/lost belongings or valuables.   Notify your doctor if there is any change in your medical condition (cold, fever, infection).  Wear comfortable clothing (specific to your surgery type) to the hospital.  After surgery, you can help prevent lung complications by doing breathing exercises.  Take deep breaths and cough every 1-2 hours. Your doctor may order a device called an Incentive Spirometer to help you take deep breaths. When coughing or sneezing, hold a pillow firmly against your incision with both hands. This is called "splinting." Doing this helps protect your incision. It also decreases belly discomfort.  If you are being admitted to the  hospital overnight, leave your suitcase in the car. After surgery it may be brought to your room.  If you are being discharged the day of surgery, you will not be allowed to drive home. You will need a responsible adult (18 years or older) to drive you home and stay with you that night.   If you are taking public transportation, you will need to have a responsible adult (18 years or older) with you. Please confirm with your physician that  it is acceptable to use public transportation.   Please call the Pre-admissions Testing Dept. at [phone] if you have any questions about these instructions.  Surgery Visitation Policy:  Patients undergoing a surgery or procedure may have two family members or support persons with them as long as the person is not COVID-19 positive or experiencing its symptoms.   Inpatient Visitation:    Visiting hours are 7 a.m. to 8 p.m. Up to four visitors are allowed at one time in a patient room. The visitors may rotate out with other people during the day. One designated support person (adult) may remain overnight.  MASKING: Due to an increase in RSV rates and hospitalizations, in-patient care areas in which we serve newborns, infants and children, masks will be required for teammates and visitors.  Children ages 68 and under may not visit. This policy affects the following departments only:  [facility] Postpartum area Mother Baby Unit Newborn nursery/Special care nursery  Other areas: Masks continue to be strongly recommended for patient-facing teammates, visitors and patients in all other areas. Visitation is not restricted outside of the units listed above.

## 2021-12-22 LAB — URINE CULTURE: Culture: >=100000 — AB

## 2021-12-22 NOTE — Progress Notes (Signed)
  [facility] Perioperative Services: Pre-Admission/Anesthesia Testing  Abnormal Lab Notification   Date: 12/22/21  Name: Justin Lara MRN:   [id number]  Re: Abnormal labs noted during PAT appointment   Notified:  Provider Name Provider Role Notification Mode  Francesco Sor, MD Orthopedics (Surgeon) Routed and/or faxed via [facility]   Abnormal Lab Value(s):   Lab Results  Component Value Date   COLORURINE YELLOW (A) 12/21/2021   APPEARANCEUR CLEAR (A) 12/21/2021   LABSPEC 1.017 12/21/2021   PHURINE 6.0 12/21/2021   GLUCOSEU NEGATIVE 12/21/2021   HGBUR NEGATIVE 12/21/2021   BILIRUBINUR NEGATIVE 12/21/2021   KETONESUR NEGATIVE 12/21/2021   PROTEINUR NEGATIVE 12/21/2021   NITRITE NEGATIVE 12/21/2021   LEUKOCYTESUR TRACE (A) 12/21/2021   EPIU 0-5 12/21/2021   WBCU 0-5 12/21/2021   RBCU 0-5 12/21/2021   BACTERIA RARE (A) 12/21/2021   CULT (A) 12/21/2021    >=100,000 COLONIES/mL GROUP B STREP(S.AGALACTIAE)ISOLATED TESTING AGAINST S. AGALACTIAE NOT ROUTINELY PERFORMED DUE TO PREDICTABILITY OF AMP/PEN/VAN SUSCEPTIBILITY. Performed at [facility], [address] [place], [place] [address]     Clinical Information and Notes:  Patient is scheduled for COMPUTER ASSISTED TOTAL KNEE ARTHROPLASTY (Left: Knee) on 12/29/2021.    UA performed in PAT consistent with/concerning for infection.  No leukocytosis noted on CBC; WBC 5100 Renal function: Estimated Creatinine Clearance: 79.3 mL/min (by C-G formula based on SCr of 1.07 mg/dL). Urine C&S added to assess for pathogenically significant growth.  Impression and Plan:  [name] with a UA that was (+) for infection; reflex culture sent. Preliminary culture (+) for significant GBS colony count. Will forward UA and culture results to primary attending surgeon. Sending results for review and consideration of preoperative treatment as deemed appropriate by Dr. Ernest Pine.   Quentin Mulling, MSN, APRN, FNP-C,  CEN [facility]  Peri-operative Services Nurse Practitioner Phone: [phone] Fax: [phone] 12/22/21 2:12 PM  NOTE: This note has been prepared using Dragon dictation software. Despite my best ability to proofread, there is always the potential that unintentional transcriptional errors may still occur from this process.

## 2021-12-26 NOTE — H&P (Signed)
ORTHOPAEDIC HISTORY & PHYSICAL Michelene Gardener, [place] - 12/21/2021 8:15 AM EST Formatting of this note is different from the original. [facility] Chief Complaint:  Chief Complaint Patient presents with Knee Pain H & P LEFT KNEE  History of Present Illness:  Justin Lara is a 66 y.o. male that presents to clinic today for his preoperative history and evaluation. Patient presents unaccompanied. The patient is scheduled to undergo a left total knee arthroplasty on 12/29/21 by Dr. Ernest Pine. His pain began approximately 1 year ago. The pain is located primarily along the medial aspect of the knee. He describes his pain as worse with weightbearing and going downstairs. He reports associated swelling. He denies associated numbness or tingling, denies locking or giving way of the knee.  The patient's symptoms have progressed to the point that they decrease his quality of life. The patient has previously undergone conservative treatment including NSAIDS and activity modifications without adequate control of his symptoms.  Denies history of lumbar surgery, significant cardiac history, or history of DVT.  Patient will have his wife at home to help post-operatively.  Past Medical, Surgical, Family, Social History, Allergies, Medications:  Past Medical History: Past Medical History: Diagnosis Date Allergic rhinitis B12 deficiency 01/27/2015 Chicken pox Chronic osteoarthritis 01/23/2015 Kidney stone OA (osteoarthritis) Pure hypercholesterolemia 01/21/2014 Thrombocytopenia (CMS-HCC) 01/23/2015 Thyroid disease  Past Surgical History: Past Surgical History: Procedure Laterality Date TONSILLECTOMY 1973 COLONOSCOPY 04/29/2005 Adenomatous Polyp COLONOSCOPY 03/31/2010 PH Adenomatous Polyp: CBF 03/2015; OA appt made 03/02/2015 (dw) KNEE ARTHROSCOPY 2017 COLONOSCOPY 03/25/2015 PH Adenomatous Polyp: CBF 03/2020 Left knee arthroscopy, partial  medial meniscetomy and chondroplasty 12/02/2015 Dr Ernest Pine CHOLECYSTECTOMY 07/2017 LITHOTRIPSY 2020 COLONOSCOPY N/A 01/19/2021 Procedure: COLONOSCOPY, FLEXIBLE; DIAGNOSTIC, INCLUDING COLLECTION OF SPECIMEN(S) BY BRUSHING OR WASHING, WHEN PERFORMED (SEPARATE PROCEDURE); Surgeon: Elnita Maxwell, MD; Location: [facility]; Service: Gastroenterology; Laterality: N/A; 12/23/2020 Reviewed [id number]  Current Medications: Current Outpatient Medications Medication Sig Dispense Refill cholecalciferol (VITAMIN D3) 1000 unit capsule Take 1,000 Units by mouth once daily cyanocobalamin (VITAMIN B12) 1000 MCG tablet Take 1,000 mcg by mouth once daily. levothyroxine (SYNTHROID) 50 MCG tablet TAKE 1 TABLET (50 MCG TOTAL) BY MOUTH ONCE DAILY TAKE ON AN EMPTY STOMACH WITH A GLASS OF WATER AT LEAST 30-60 MINUTES BEFORE BREAKFAST. 90 tablet 3  No current facility-administered medications for this visit.  Allergies: No Known Allergies  Social History: Social History  Socioeconomic History Marital status: Married Spouse name: Porfirio Mylar Number of children: 2 Years of education: 16+ Highest education level: Bachelor's degree (e.g., BA, AB, BS) Occupational History Occupation: Producer, television/film/video Tobacco Use Smoking status: Never Smokeless tobacco: Never Vaping Use Vaping Use: Never used Substance and Sexual Activity Alcohol use: Never Alcohol/week: 0.0 standard drinks of alcohol Comment: rare Drug use: Never Sexual activity: Yes Partners: Female Birth control/protection: None  Family History: Family History Problem Relation Age of Onset Parkinsonism Mother Prostate cancer Father Prostate cancer Brother Colon cancer Neg Hx Colon polyps Neg Hx  Review of Systems:  A 10+ ROS was performed, reviewed, and the pertinent orthopaedic findings are documented in the HPI.  Physical Examination:  BP (!) 140/90 (BP Location: Left upper arm, Patient Position:  Sitting, BP Cuff Size: Adult)  Ht 180.3 cm (5\' 11" )  Wt 96.7 kg (213 lb 3.2 oz)  BMI 29.74 kg/m  Patient is a well-developed, well-nourished male in no acute distress. Patient has normal mood and affect. Patient is alert and oriented to person, place, and time.  HEENT: Atraumatic, normocephalic. Pupils equal and reactive to light. Extraocular motion intact. Noninjected sclera.  Cardiovascular: Regular rate and rhythm, with no murmurs, rubs, or gallops. Distal pulses palpable.  Respiratory: Lungs clear to auscultation bilaterally.  Left Knee: Soft tissue swelling: minimal Effusion: none Erythema: none Crepitance: mild Tenderness: medial Alignment: relative varus Mediolateral laxity: medial pseudolaxity Posterior sag: negative Patellar tracking: Good tracking without evidence of subluxation or tilt Atrophy: No significant atrophy. Quadriceps tone was good. Range of motion: 0/3/128 degrees  Patient able to actively plantarflex and dorsiflex the ankle, able to flex and extend the toes.  Sensation intact over the saphenous, lateral sural cutaneous, superficial fibular, and deep fibular nerve distributions.  Tests Performed/Reviewed: X-rays  3 views of the left knee were obtained. Images reveal severe loss of medial compartment joint space with osteophyte formation. No fractures or dislocations. No other osseous abnormality noted. 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  I personally ordered and interpreted today's x-rays.  Impression:  ICD-10-CM 1. Primary osteoarthritis of left knee M17.12  Plan:  The patient has end-stage degenerative changes of the left knee. It was explained to the patient that the condition is progressive in nature. Having failed conservative treatment, the patient has elected to proceed with a total joint arthroplasty. The patient will undergo a total joint arthroplasty with Dr. Ernest Pine. The risks of surgery, including blood clot and infection,  were discussed with the patient. Measures to reduce these risks, including the use of anticoagulation, perioperative antibiotics, and early ambulation were discussed. The importance of postoperative physical therapy was discussed with the patient. The patient elects to proceed with surgery. The patient is instructed to stop all blood thinners prior to surgery. The patient is instructed to call the hospital the day before surgery to learn of the proper arrival time.  Contact our office with any questions or concerns. Follow up as indicated, or sooner should any new problems arise, if conditions worsen, or if they are otherwise concerned.  Michelene Gardener, PA -C [facility] [address] [place], [place] [address] Phone: [phone]  This note was generated in part with voice recognition software and I apologize for any typographical errors that were not detected and corrected.  Electronically signed by Michelene Gardener, PA at 12/21/2021 1:19 PM EST

## 2021-12-29 ENCOUNTER — Ambulatory Visit
Admission: RE | Admit: 2021-12-29 | Payer: BC Managed Care – PPO | Source: Ambulatory Visit | Admitting: Orthopedic Surgery

## 2021-12-29 ENCOUNTER — Encounter: Admission: RE | Payer: Self-pay | Source: Ambulatory Visit

## 2021-12-29 DIAGNOSIS — M1712 Unilateral primary osteoarthritis, left knee: Secondary | ICD-10-CM

## 2021-12-29 DIAGNOSIS — D696 Thrombocytopenia, unspecified: Secondary | ICD-10-CM

## 2021-12-29 DIAGNOSIS — E538 Deficiency of other specified B group vitamins: Secondary | ICD-10-CM

## 2021-12-29 DIAGNOSIS — E039 Hypothyroidism, unspecified: Secondary | ICD-10-CM

## 2021-12-29 SURGERY — ARTHROPLASTY, KNEE, TOTAL, USING IMAGELESS COMPUTER-ASSISTED NAVIGATION
Anesthesia: Choice | Site: Knee | Laterality: Left

## 2022-01-07 IMAGING — MR MR PROSTATE WO/W CM
56 series · 56 of 56 positions shown · IV contrast (10ml Gadavist)
Comparison: None
COMPARISON: None

Addendum:
CLINICAL DATA: Elevated PSA to 4.67 on 04/30/2020. 65-year-old
male.

EXAM:
MR PROSTATE WITHOUT AND WITH CONTRAST
TECHNIQUE: Multiplanar multisequence MRI images were obtained of the pelvis
centered about the prostate. Pre and post contrast images were
obtained.
CONTRAST:  10mL GADAVIST GADOBUTROL 1 MMOL/ML IV SOLN

[Series 3: ax in&out whole · axial · 3.0mm · 1.19mm/px · 1 of 96 slices shown (1 of 2)]
[im 1/96]
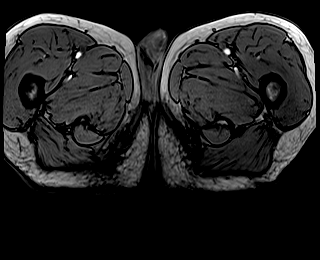

[Series 4: ax in&out whole · axial · 3.0mm · 1.19mm/px · 1 of 96 slices shown (2 of 2)]
[im 1/96]
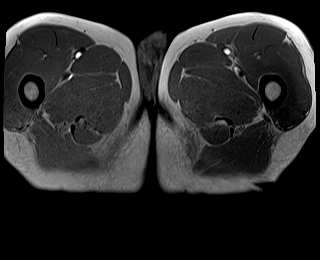

[Series 5: T2 · coronal · 3.0mm · 0.70mm/px · 1 of 35 slices shown (1 of 3)]
[im 1/35]
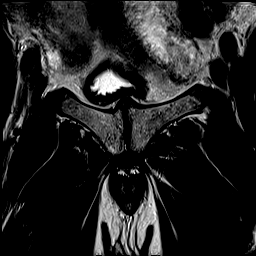

[Series 6: T2 · axial · 3.0mm · 0.56mm/px · 1 of 29 slices shown (2 of 3)]
[im 1/29]
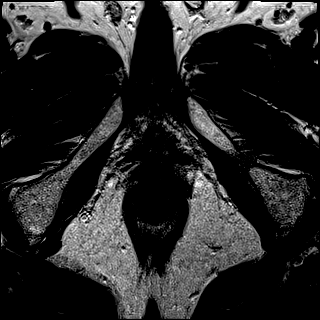

[Series 7: DWI · axial · 3.0mm · 0.86mm/px · 1 of 75 slices shown (1 of 3)]
[im 1/75]
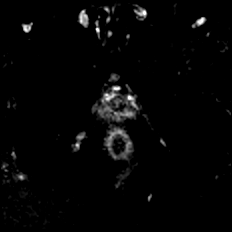

[Series 8: DWI · axial · 3.0mm · 0.86mm/px · 1 of 25 slices shown (2 of 3)]
[im 1/25]
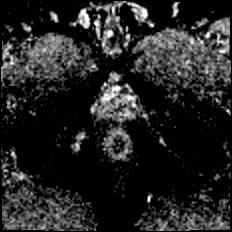

[Series 9: DWI · axial · 3.0mm · 0.86mm/px · 1 of 24 slices shown (3 of 3)]
[im 1/24]
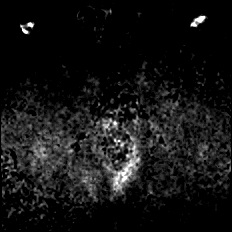

[Series 10: T2 · axial · 1.0mm · 1.04mm/px · 1 of 72 slices shown (3 of 3)]
[im 1/72]
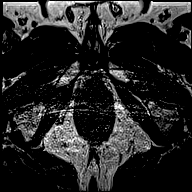

[Series 11: T1 · axial · 3.0mm · 1.15mm/px · 1 of 28 slices shown (1 of 48)]
[im 1/28]
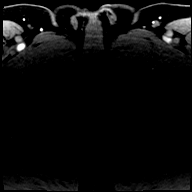

[Series 12: T1 · axial · 3.0mm · 1.15mm/px · 1 of 28 slices shown (2 of 48)]
[im 1/28]
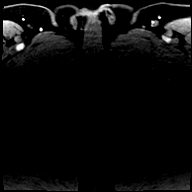

[Series 13: T1 · axial · 3.0mm · 1.15mm/px · 1 of 22 slices shown (3 of 48)]
[im 1/22]
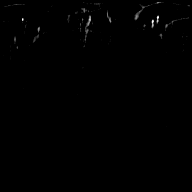

[Series 14: T1 · axial · 3.0mm · 1.15mm/px · 1 of 28 slices shown (4 of 48)]
[im 1/28]
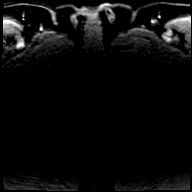

[Series 15: T1 · axial · 3.0mm · 1.15mm/px · 1 of 21 slices shown (5 of 48)]
[im 1/21]
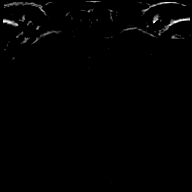

[Series 16: T1 · axial · 3.0mm · 1.15mm/px · 1 of 28 slices shown (6 of 48)]
[im 1/28]
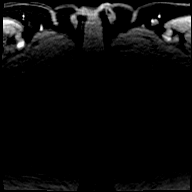

[Series 17: T1 · axial · 3.0mm · 1.15mm/px · 1 of 23 slices shown (7 of 48)]
[im 1/23]
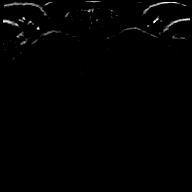

[Series 18: T1 · axial · 3.0mm · 1.15mm/px · 1 of 28 slices shown (8 of 48)]
[im 1/28]
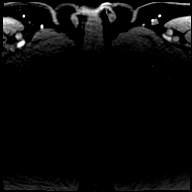

[Series 19: T1 · axial · 3.0mm · 1.15mm/px · 1 of 23 slices shown (9 of 48)]
[im 1/23]
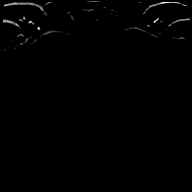

[Series 20: T1 · axial · 3.0mm · 1.15mm/px · 1 of 28 slices shown (10 of 48)]
[im 1/28]
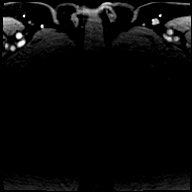

[Series 21: T1 · axial · 3.0mm · 1.15mm/px · 1 of 28 slices shown (11 of 48)]
[im 1/28]
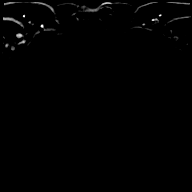

[Series 22: T1 · axial · 3.0mm · 1.15mm/px · 1 of 28 slices shown (12 of 48)]
[im 1/28]
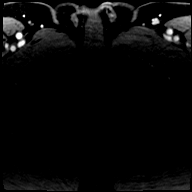

[Series 23: T1 · axial · 3.0mm · 1.15mm/px · 1 of 27 slices shown (13 of 48)]
[im 1/27]
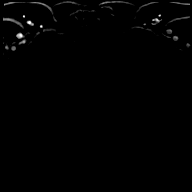

[Series 24: T1 · axial · 3.0mm · 1.15mm/px · 1 of 28 slices shown (14 of 48)]
[im 1/28]
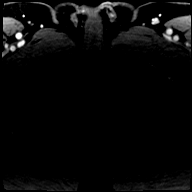

[Series 25: T1 · axial · 3.0mm · 1.15mm/px · 1 of 24 slices shown (15 of 48)]
[im 1/24]
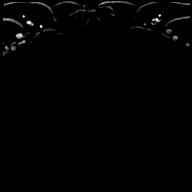

[Series 26: T1 · axial · 3.0mm · 1.15mm/px · 1 of 28 slices shown (16 of 48)]
[im 1/28]
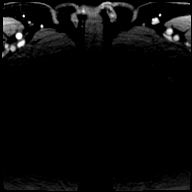

[Series 27: T1 · axial · 3.0mm · 1.15mm/px · 1 of 26 slices shown (17 of 48)]
[im 1/26]
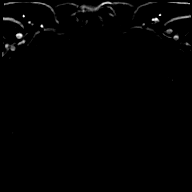

[Series 28: T1 · axial · 3.0mm · 1.15mm/px · 1 of 28 slices shown (18 of 48)]
[im 1/28]
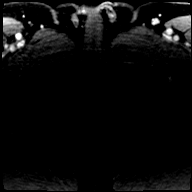

[Series 29: T1 · axial · 3.0mm · 1.15mm/px · 1 of 25 slices shown (19 of 48)]
[im 1/25]
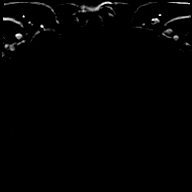

[Series 30: T1 · axial · 3.0mm · 1.15mm/px · 1 of 28 slices shown (20 of 48)]
[im 1/28]
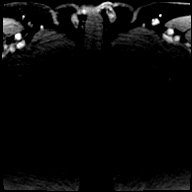

[Series 31: T1 · axial · 3.0mm · 1.15mm/px · 1 of 28 slices shown (21 of 48)]
[im 1/28]
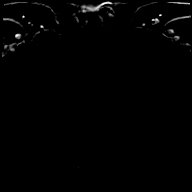

[Series 32: T1 · axial · 3.0mm · 1.15mm/px · 1 of 28 slices shown (22 of 48)]
[im 1/28]
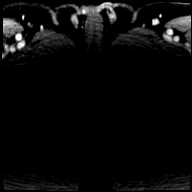

[Series 33: T1 · axial · 3.0mm · 1.15mm/px · 1 of 28 slices shown (23 of 48)]
[im 1/28]
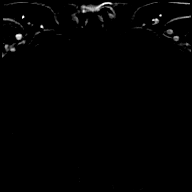

[Series 34: T1 · axial · 3.0mm · 1.15mm/px · 1 of 28 slices shown (24 of 48)]
[im 1/28]
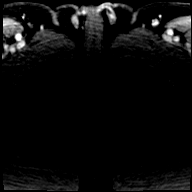

[Series 35: T1 · axial · 3.0mm · 1.15mm/px · 1 of 28 slices shown (25 of 48)]
[im 1/28]
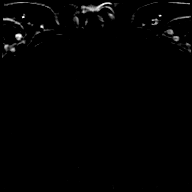

[Series 36: T1 · axial · 3.0mm · 1.15mm/px · 1 of 28 slices shown (26 of 48)]
[im 1/28]
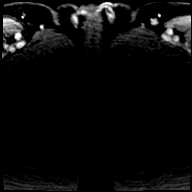

[Series 37: T1 · axial · 3.0mm · 1.15mm/px · 1 of 28 slices shown (27 of 48)]
[im 1/28]
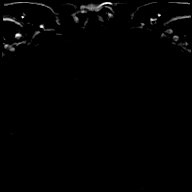

[Series 38: T1 · axial · 3.0mm · 1.15mm/px · 1 of 28 slices shown (28 of 48)]
[im 1/28]
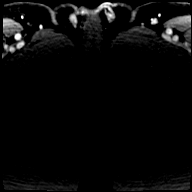

[Series 39: T1 · axial · 3.0mm · 1.15mm/px · 1 of 28 slices shown (29 of 48)]
[im 1/28]
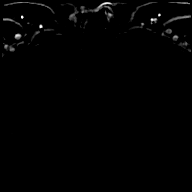

[Series 40: T1 · axial · 3.0mm · 1.15mm/px · 1 of 28 slices shown (30 of 48)]
[im 1/28]
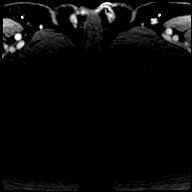

[Series 41: T1 · axial · 3.0mm · 1.15mm/px · 1 of 28 slices shown (31 of 48)]
[im 1/28]
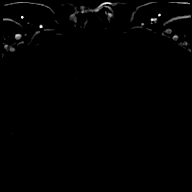

[Series 42: T1 · axial · 3.0mm · 1.15mm/px · 1 of 28 slices shown (32 of 48)]
[im 1/28]
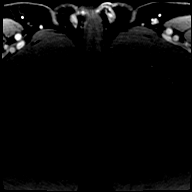

[Series 43: T1 · axial · 3.0mm · 1.15mm/px · 1 of 28 slices shown (33 of 48)]
[im 1/28]
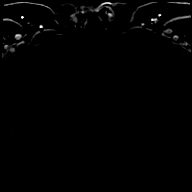

[Series 44: T1 · axial · 3.0mm · 1.15mm/px · 1 of 28 slices shown (34 of 48)]
[im 1/28]
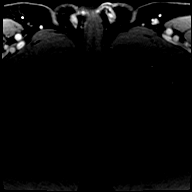

[Series 45: T1 · axial · 3.0mm · 1.15mm/px · 1 of 28 slices shown (35 of 48)]
[im 1/28]
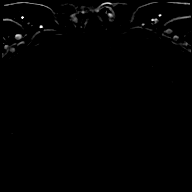

[Series 46: T1 · axial · 3.0mm · 1.15mm/px · 1 of 28 slices shown (36 of 48)]
[im 1/28]
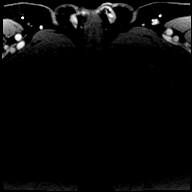

[Series 47: T1 · axial · 3.0mm · 1.15mm/px · 1 of 28 slices shown (37 of 48)]
[im 1/28]
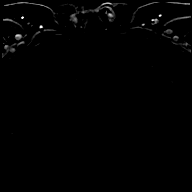

[Series 48: T1 · axial · 3.0mm · 1.15mm/px · 1 of 28 slices shown (38 of 48)]
[im 1/28]
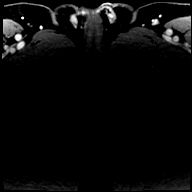

[Series 49: T1 · axial · 3.0mm · 1.15mm/px · 1 of 28 slices shown (39 of 48)]
[im 1/28]
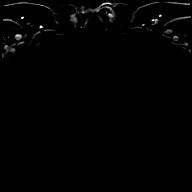

[Series 50: T1 · axial · 3.0mm · 1.15mm/px · 1 of 28 slices shown (40 of 48)]
[im 1/28]
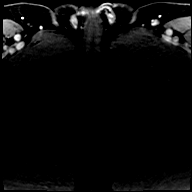

[Series 51: T1 · axial · 3.0mm · 1.15mm/px · 1 of 28 slices shown (41 of 48)]
[im 1/28]
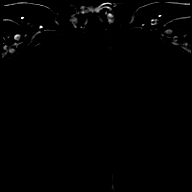

[Series 52: T1 · axial · 3.0mm · 1.15mm/px · 1 of 28 slices shown (42 of 48)]
[im 1/28]
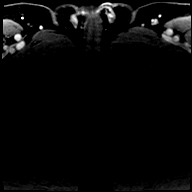

[Series 53: T1 · axial · 3.0mm · 1.15mm/px · 1 of 28 slices shown (43 of 48)]
[im 1/28]
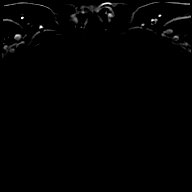

[Series 54: T1 · axial · 3.0mm · 1.15mm/px · 1 of 28 slices shown (44 of 48)]
[im 1/28]
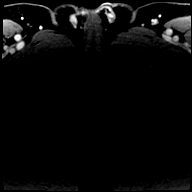

[Series 55: T1 · axial · 3.0mm · 1.15mm/px · 1 of 28 slices shown (45 of 48)]
[im 1/28]
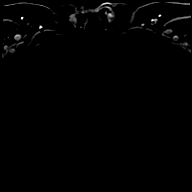

[Series 56: T1 · axial · 3.0mm · 1.15mm/px · 1 of 28 slices shown (46 of 48)]
[im 1/28]
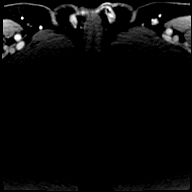

[Series 57: T1 · axial · 3.0mm · 1.15mm/px · 1 of 28 slices shown (47 of 48)]
[im 1/28]
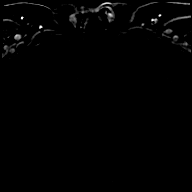

[Series 58: T1 · axial · 3.0mm · 1.15mm/px · 1 of 28 slices shown (48 of 48)]
[im 1/28]
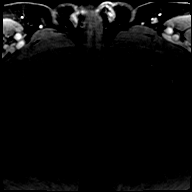

[56 of 56 positions shown; findings below may reference images not displayed]

FINDINGS: Prostate: Signs of BPH and sequela of prostatitis with linear and
wedge-shaped areas of T2 hypointensity throughout the gland in the
peripheral zone and nodular changes in the transitional zone.

Transitional zone: BPH without signs of high-risk lesion.

Peripheral zone: T2 hypointense area in the anterior and lateral
RIGHT apical peripheral zone showing restricted diffusion (image
[DATE]) 13 mm this tracks towards the RIGHT apex where there is some
bulging of the capsule best seen on image [DATE]. High-resolution
images with limited assessment due to motion artifact. PIRADS
category 4.

Volume: 48.4 cc

Transcapsular spread: Equivocal based on limitations of
high-resolution imaging as discussed.

Seminal vesicle involvement: Absent

Neurovascular bundle involvement: At risk based on appearance of
standard T2 images but with limited assessment due to motion on both
standard T2 is and high-resolution images.

Pelvic adenopathy: Absent

Bone metastasis: Absent

Other findings: None
IMPRESSION: 1. 13 mm area of restricted diffusion in the anterior and lateral
RIGHT apical peripheral zone showing restricted diffusion. This
tracks towards the RIGHT apex where there is some bulging of the
capsule. PIRADS category 4. Equivocal findings for extracapsular
extension. The patient is being recalled for repeat high-resolution
T2 images to further evaluate this finding
2. Neurovascular bundle at risk based on current images.
3. No signs of nodal disease or bony metastases.
4. Changes of BPH and background changes of prostatitis as
described.

ADDENDUM:
There is bulging of the RIGHT prostate capsule near the apex at the
site of the PIRADS category 4 lesion along the lateral margin
abutting pelvic floor musculature. Findings are suspicious for
extracapsular extension but the area appears to spare the
neurovascular bundle.

These results were called by telephone at the time of interpretation
on 09/04/2020 at [DATE] to provider CHHAM MINTO , who verbally
acknowledged these results.

*** End of Addendum ***
FINDINGS: Prostate: Signs of BPH and sequela of prostatitis with linear and
wedge-shaped areas of T2 hypointensity throughout the gland in the
peripheral zone and nodular changes in the transitional zone.

Transitional zone: BPH without signs of high-risk lesion.

Peripheral zone: T2 hypointense area in the anterior and lateral
RIGHT apical peripheral zone showing restricted diffusion (image
[DATE]) 13 mm this tracks towards the RIGHT apex where there is some
bulging of the capsule best seen on image [DATE]. High-resolution
images with limited assessment due to motion artifact. PIRADS
category 4.

Volume: 48.4 cc

Transcapsular spread: Equivocal based on limitations of
high-resolution imaging as discussed.

Seminal vesicle involvement: Absent

Neurovascular bundle involvement: At risk based on appearance of
standard T2 images but with limited assessment due to motion on both
standard T2 is and high-resolution images.

Pelvic adenopathy: Absent

Bone metastasis: Absent

Other findings: None
IMPRESSION: 1. 13 mm area of restricted diffusion in the anterior and lateral
RIGHT apical peripheral zone showing restricted diffusion. This
tracks towards the RIGHT apex where there is some bulging of the
capsule. PIRADS category 4. Equivocal findings for extracapsular
extension. The patient is being recalled for repeat high-resolution
T2 images to further evaluate this finding
2. Neurovascular bundle at risk based on current images.
3. No signs of nodal disease or bony metastases.
4. Changes of BPH and background changes of prostatitis as
described.

## 2022-01-29 NOTE — Discharge Instructions (Signed)
Instructions after Total Knee Replacement   Tiauna Whisnant P. Sutton Plake, Jr., M.D.     Dept. of Orthopaedics & Sports Medicine  [facility]  [address]  [place], [place]  [address]  Phone: [phone]   Fax: [phone]    DIET: Drink plenty of non-alcoholic fluids. Resume your normal diet. Include foods high in fiber.  ACTIVITY:  You may use crutches or a walker with weight-bearing as tolerated, unless instructed otherwise. You may be weaned off of the walker or crutches by your Physical Therapist.  Do NOT place pillows under the knee. Anything placed under the knee could limit your ability to straighten the knee.   Continue doing gentle exercises. Exercising will reduce the pain and swelling, increase motion, and prevent muscle weakness.   Please continue to use the TED compression stockings for 6 weeks. You may remove the stockings at night, but should reapply them in the morning. Do not drive or operate any equipment until instructed.  WOUND CARE:  Continue to use the PolarCare or ice packs periodically to reduce pain and swelling. You may bathe or shower after the staples are removed at the first office visit following surgery.  MEDICATIONS: You may resume your regular medications. Please take the pain medication as prescribed on the medication. Do not take pain medication on an empty stomach. You have been given a prescription for a blood thinner (Lovenox or Coumadin). Please take the medication as instructed. (NOTE: After completing a 2 week course of Lovenox, take one Enteric-coated aspirin once a day. This along with elevation will help reduce the possibility of phlebitis in your operated leg.) Do not drive or drink alcoholic beverages when taking pain medications.  CALL THE OFFICE FOR: Temperature above 101 degrees Excessive bleeding or drainage on the dressing. Excessive swelling, coldness, or paleness of the toes. Persistent nausea and vomiting.  FOLLOW-UP:  You  should have an appointment to return to the office in 10-14 days after surgery. Arrangements have been made for continuation of Physical Therapy (either home therapy or outpatient therapy).   [facility] Department Directory         [email or web address]       [email or web address]          Cardiology  Appointments: [place] - [phone] [place] - [phone]  Endocrinology  Appointments: [place] - [phone] [place] - [phone]  Gastroenterology  Appointments: [place] - [phone] [place] - [phone]        General Surgery   Appointments: [place] - [phone]  Internal Medicine/Family Medicine  Appointments: [place] - [phone] [place] - [phone] [place] - [phone]  Metabolic and Weigh Loss Surgery  Appointments: [place] - [phone]        Neurology  Appointments: [place] - [phone] [place] - [phone]  Neurosurgery  Appointments: [place] - [phone]  Obstetrics & Gynecology  Appointments: [place] - [phone] [place] - [phone]        Pediatrics  Appointments: [place] - [phone] [place] - [phone]  Physiatry  Appointments: [place] -[phone]  Physical Therapy  Appointments: [place] - [phone] [place] - [phone]        Podiatry  Appointments: [place] - [phone] [place] - [phone]  Pulmonology  Appointments: [place] - [phone]  Rheumatology  Appointments: [place] - [phone]        [place] Location: [facility]  [address] , [place]  [address]  [place] Location: [facility] [address] [place], [place]  [address]  [place] Location: [facility] [address] [place], [place]  [address]    

## 2022-01-31 ENCOUNTER — Encounter
Admission: RE | Admit: 2022-01-31 | Discharge: 2022-01-31 | Disposition: A | Discharge status: Home / Self Care | Payer: BC Managed Care – PPO | Source: Ambulatory Visit | Attending: Orthopedic Surgery | Admitting: Orthopedic Surgery

## 2022-01-31 VITALS — BP 117/82 | HR 59 | Temp 97.5°F | Resp 16 | Ht 72.0 in | Wt 214.4 lb

## 2022-01-31 DIAGNOSIS — Z01812 Encounter for preprocedural laboratory examination: Secondary | ICD-10-CM | POA: Diagnosis not present

## 2022-01-31 DIAGNOSIS — M1712 Unilateral primary osteoarthritis, left knee: Secondary | ICD-10-CM

## 2022-01-31 LAB — URINALYSIS, ROUTINE W REFLEX MICROSCOPIC
Bacteria, UA: NONE SEEN
Bilirubin Urine: NEGATIVE
Glucose, UA: NEGATIVE mg/dL
Hgb urine dipstick: NEGATIVE
Ketones, ur: NEGATIVE mg/dL
Nitrite: NEGATIVE
Protein, ur: NEGATIVE mg/dL
Specific Gravity, Urine: 1.023 (ref 1.005–1.030)
Squamous Epithelial / HPF: NONE SEEN /HPF (ref 0–5)
pH: 5 (ref 5.0–8.0)

## 2022-01-31 LAB — TYPE AND SCREEN
ABO/RH(D): A POS
Antibody Screen: NEGATIVE

## 2022-01-31 NOTE — Patient Instructions (Addendum)
Your procedure is scheduled on: Friday February 11, 2022. Report to Day Surgery inside [facility] 2nd floor, stop by registration desk before getting on elevator.  To find out your arrival time please call [phone] between 1PM - 3PM on Thursday February 10, 2022.  Remember: Instructions that are not followed completely may result in serious medical risk,  up to and including death, or upon the discretion of your surgeon and anesthesiologist your  surgery may need to be rescheduled.     _X__ 1. Do not eat food after midnight the night before your procedure.                 No chewing gum or hard candies. You may drink clear liquids up to 2 hours                 before you are scheduled to arrive for your surgery- DO not drink clear                 liquids within 2 hours of the start of your surgery.                 Clear Liquids include:  water, apple juice without pulp, clear Gatorade, G2 or                  Gatorade Zero (avoid Red/Purple/Blue), Black Coffee or Tea (Do not add                 anything to coffee or tea).  __X__2.   Complete the "Ensure Clear Pre-surgery Clear Carbohydrate Drink" provided to you, 2 hours before arrival. **If you are diabetic you will be provided with an alternative drink, Gatorade Zero or G2.  __X__3.  On the morning of surgery brush your teeth with toothpaste and water, you                may rinse your mouth with mouthwash if you wish.  Do not swallow any toothpaste of mouthwash.     _X__ 4.  No Alcohol for 24 hours before or after surgery.   _X__ 5.  Do Not Smoke or use e-cigarettes For 24 Hours Prior to Your Surgery.                 Do not use any chewable tobacco products for at least 6 hours prior to                 Surgery.  _X__  6.  Do not use any recreational drugs (marijuana, cocaine, heroin, ecstasy, MDMA or other)                For at least one week prior to your surgery.  Combination of these drugs with  anesthesia                May have life threatening results.  ____  7.  Bring all medications with you on the day of surgery if instructed.   __X__  8.  Notify your doctor if there is any change in your medical condition      (cold, fever, infections).     Do not wear jewelry, make-up, hairpins, clips or nail polish. Do not wear lotions, powders, or perfumes. You may wear deodorant. Do not shave 48 hours prior to surgery. Men may shave face and neck. Do not bring valuables to the hospital.    [facility] is not responsible for any belongings or valuables.  Contacts, dentures or bridgework may not be worn into surgery. Leave your suitcase in the car. After surgery it may be brought to your room. For patients admitted to the hospital, discharge time is determined by your treatment team.   Patients discharged the day of surgery will not be allowed to drive home.   Make arrangements for someone to be with you for the first 24 hours of your Same Day Discharge.   __X__ Take these medicines the morning of surgery with A SIP OF WATER:    1. levothyroxine (SYNTHROID, LEVOTHROID) 25 MCG   2.   3.   4.  5.  6.  ____ Fleet Enema (as directed)   __X__ Use CHG Soap (or wipes) as directed  ____ Use Benzoyl Peroxide Gel as instructed  ____ Use inhalers on the day of surgery  ____ Stop metformin 2 days prior to surgery    ____ Take 1/2 of usual insulin dose the night before surgery. No insulin the morning          of surgery.   ____ Call your PCP, cardiologist, or Pulmonologist if taking Coumadin/Plavix/aspirin and ask when to stop before your surgery.   __X__ One Week prior to surgery- Stop Anti-inflammatories such as Ibuprofen, Aleve, Advil, Motrin, meloxicam (MOBIC), diclofenac, etodolac, ketorolac, Toradol, Daypro, piroxicam, Goody's or BC powders. OK TO USE TYLENOL IF NEEDED   __X__ Stop supplements until after surgery.    ____ Bring C-Pap to the hospital.    If you have  any questions regarding your pre-procedure instructions,  Please call Pre-admit Testing at [phone]    Preparing for Surgery with CHLORHEXIDINE GLUCONATE (CHG) Soap  Chlorhexidine Gluconate (CHG) Soap  o An antiseptic cleaner that kills germs and bonds with the skin to continue killing germs even after washing  o Used for showering the night before surgery and morning of surgery  Before surgery, you can play an important role by reducing the number of germs on your skin.  CHG (Chlorhexidine gluconate) soap is an antiseptic cleanser which kills germs and bonds with the skin to continue killing germs even after washing.  Please do not use if you have an allergy to CHG or antibacterial soaps. If your skin becomes reddened/irritated stop using the CHG.  1. Shower the NIGHT BEFORE SURGERY and the MORNING OF SURGERY with CHG soap.  2. If you choose to wash your hair, wash your hair first as usual with your normal shampoo.  3. After shampooing, rinse your hair and body thoroughly to remove the shampoo.  4. Use CHG as you would any other liquid soap. You can apply CHG directly to the skin and wash gently with a scrungie or a clean washcloth.  5. Apply the CHG soap to your body only from the neck down. Do not use on open wounds or open sores. Avoid contact with your eyes, ears, mouth, and genitals (private parts). Wash face and genitals (private parts) with your normal soap.  6. Wash thoroughly, paying special attention to the area where your surgery will be performed.  7. Thoroughly rinse your body with warm water.  8. Do not shower/wash with your normal soap after using and rinsing off the CHG soap.  9. Pat yourself dry with a clean towel.  10. Wear clean pajamas to bed the night before surgery.  12. Place clean sheets on your bed the night of your first shower and do not sleep with pets.  13. Shower again with the CHG soap on  the day of surgery prior to arriving at the  hospital.  14. Do not apply any deodorants/lotions/powders.  15. Please wear clean clothes to the hospital.

## 2022-02-06 NOTE — H&P (Signed)
ORTHOPAEDIC HISTORY & PHYSICAL Regino Bellow, PA - 02/01/2022 4:00 PM EST Formatting of this note is different from the original. Images from the original note were not included. Chief Complaint Chief Complaint Patient presents with Knee Pain H & P LEFT KNEE  Reason for Visit Justin Lara is a 67 y.o. who presents today for history and physical. He is to undergo a left total knee arthroplasty on 02/11/2022. Was scheduled for December but due to dental procedures this had to be postponed. Since his last visit he has not had any change in his condition. Patient wishes to proceed with surgery.  Patient had previously undergone a left knee arthroscopy in 2017. He began having some increased discomfort approxi-1 year ago. He localizes most of the pain along the medial aspect of the knee. He reports some swelling, no locking, and no giving way of the knee. The pain is aggravated by descending stairs. The knee pain limits the patient's ability to ambulate long distances. The patient has not appreciated any significant improvement despite viscosupplementation and activity modification. He is not using any ambulatory aids. The patient states that the knee pain has progressed to the point that it is beginning to interfere with his activities of daily living.  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 Kidney stone OA (osteoarthritis) Pure hypercholesterolemia 01/21/2014 Thrombocytopenia (CMS-HCC) 01/23/2015 Thyroid disease  Past Surgical History Past Surgical History: Procedure Laterality Date TONSILLECTOMY 1973 COLONOSCOPY 04/29/2005 Adenomatous Polyp COLONOSCOPY 03/31/2010 PH Adenomatous Polyp: CBF 03/2015; OA appt made 03/02/2015 (dw) KNEE ARTHROSCOPY 2017 COLONOSCOPY 03/25/2015 PH Adenomatous Polyp: CBF 03/2020 Left knee arthroscopy, partial medial meniscetomy and  chondroplasty 12/02/2015 Dr Marry Guan CHOLECYSTECTOMY 07/2017 LITHOTRIPSY 2020 COLONOSCOPY N/A 01/19/2021 Procedure: COLONOSCOPY, FLEXIBLE; DIAGNOSTIC, INCLUDING COLLECTION OF SPECIMEN(S) BY BRUSHING OR WASHING, WHEN PERFORMED (SEPARATE PROCEDURE); Surgeon: Josefine Class, MD; Location: [place]; Service: Gastroenterology; Laterality: N/A; 12/23/2020 Reviewed [id number]  Past Family History Family History Problem Relation Age of Onset Parkinsonism Mother Prostate cancer Father Prostate cancer Brother Colon cancer Neg Hx Colon polyps Neg Hx  Medications Current Outpatient Medications Ordered in Epic Medication Sig Dispense Refill cholecalciferol (VITAMIN D3) 1000 unit capsule Take 1,000 Units by mouth once daily cyanocobalamin (VITAMIN B12) 1000 MCG tablet Take 1,000 mcg by mouth once daily. levothyroxine (SYNTHROID) 50 MCG tablet TAKE 1 TABLET (50 MCG TOTAL) BY MOUTH ONCE DAILY TAKE ON AN EMPTY STOMACH WITH A GLASS OF WATER AT LEAST 30-60 MINUTES BEFORE BREAKFAST. 90 tablet 3  No current Epic-ordered facility-administered medications on file.  Allergies No Known Allergies  Review of Systems A comprehensive 14 point ROS was performed, reviewed, and the pertinent orthopaedic findings are documented in the HPI.  Exam BP 124/80 (BP Location: Left upper arm, Patient Position: Sitting, BP Cuff Size: Large Adult)  Ht 180.3 cm (5\' 11" )  Wt 97 kg (213 lb 12.8 oz)  BMI 29.82 kg/m  General: Well-developed well-nourished male seen in no acute distress.  HEENT: Atraumatic,normocephalic. Pupils are equal and reactive to light. Oropharynx is clear with moist mucosa  Lungs: Clear to auscultation bilaterally  Cardiovascular: Regular rate and rhythm. Normal S1, S2. No murmurs. No appreciable gallops or rubs. Peripheral pulses are palpable.  Abdomen: Soft, non-tender, nondistended. Bowel sounds present  Extremity: Left Knee: Soft tissue swelling:  minimal Effusion: none Erythema: none Crepitance: mild Tenderness: medial Alignment: relative varus Mediolateral laxity: medial pseudolaxity Posterior sag: negative Patellar tracking: Good tracking without evidence of subluxation  or tilt Atrophy: No significant atrophy. Quadriceps tone was good. Range of motion: 0/3/128 degrees   Neurological:  The patient is alert and oriented Sensation to light touch appears to be intact and within normal limits Gross motor strength appeared to be equal to 5/5  Vascular :  Peripheral pulses felt to be palpable. Capillary refill appears to be intact and within normal limits  X-ray  X-rays taken in clinic clinical on 12/21/2021 showed bone-on-bone to the medial compartment space. Is noted to have osteophytes as well as subchondral porosis. No fractures or dislocation was noted. 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  Impression  1. Degenerative arthrosis left knee  Plan  1. Patient currently taking no anticoagulation medication 2. Past medical history was reviewed 3. Discussed postop rehab course 4. Return to clinic 2 weeks postop. Sooner if any problems  This note was generated in part with voice recognition software and I apologize for any typographical errors that were not detected and corrected   [name] PA Electronically signed by Regino Bellow, PA at 02/01/2022 4:34 PM EST

## 2022-02-10 MED ORDER — CHLORHEXIDINE GLUCONATE 0.12 % MT SOLN: 15.0000 mL | Freq: Once | OROMUCOSAL | Status: DC

## 2022-02-10 MED ORDER — ORAL CARE MOUTH RINSE: 15.0000 mL | Freq: Once | OROMUCOSAL | Status: DC

## 2022-02-10 MED ORDER — FAMOTIDINE 20 MG PO TABS: 20.0000 mg | ORAL_TABLET | Freq: Once | ORAL | Status: AC

## 2022-02-10 MED ORDER — GABAPENTIN 300 MG PO CAPS: 300.0000 mg | ORAL_CAPSULE | Freq: Once | ORAL | Status: DC

## 2022-02-10 MED ORDER — CEFAZOLIN SODIUM-DEXTROSE 2-4 GM/100ML-% IV SOLN: 2.0000 g | INTRAVENOUS | Status: DC

## 2022-02-10 MED ORDER — CELECOXIB 200 MG PO CAPS: 400.0000 mg | ORAL_CAPSULE | Freq: Once | ORAL | Status: DC

## 2022-02-10 MED ORDER — DEXAMETHASONE SODIUM PHOSPHATE 10 MG/ML IJ SOLN: 8.0000 mg | Freq: Once | INTRAMUSCULAR | Status: DC

## 2022-02-10 MED ORDER — TRANEXAMIC ACID-NACL 1000-0.7 MG/100ML-% IV SOLN: 1000.0000 mg | INTRAVENOUS | Status: DC

## 2022-02-10 MED ORDER — FAMOTIDINE 20 MG PO TABS: 20.0000 mg | ORAL_TABLET | Freq: Once | ORAL | Status: DC

## 2022-02-10 MED ORDER — CHLORHEXIDINE GLUCONATE 4 % EX LIQD: 60.0000 mL | Freq: Once | CUTANEOUS | Status: DC

## 2022-02-10 MED ORDER — LACTATED RINGERS IV SOLN: INTRAVENOUS | Status: DC

## 2022-02-11 ENCOUNTER — Observation Stay: Payer: BC Managed Care – PPO

## 2022-02-11 ENCOUNTER — Ambulatory Visit: Payer: BC Managed Care – PPO | Admitting: Certified Registered"

## 2022-02-11 ENCOUNTER — Encounter: Payer: Self-pay | Admitting: Orthopedic Surgery

## 2022-02-11 ENCOUNTER — Observation Stay
Admission: RE | Admit: 2022-02-11 | Discharge: 2022-02-12 | Disposition: A | Discharge status: Home Health | Payer: BC Managed Care – PPO | Attending: Orthopedic Surgery | Admitting: Orthopedic Surgery

## 2022-02-11 ENCOUNTER — Other Ambulatory Visit: Payer: Self-pay

## 2022-02-11 ENCOUNTER — Encounter
Admission: RE | Disposition: A | Discharge status: Home Health | Payer: Self-pay | Source: Home / Self Care | Attending: Orthopedic Surgery

## 2022-02-11 DIAGNOSIS — E039 Hypothyroidism, unspecified: Secondary | ICD-10-CM | POA: Diagnosis not present

## 2022-02-11 DIAGNOSIS — Z79899 Other long term (current) drug therapy: Secondary | ICD-10-CM | POA: Diagnosis not present

## 2022-02-11 DIAGNOSIS — E7800 Pure hypercholesterolemia, unspecified: Secondary | ICD-10-CM

## 2022-02-11 DIAGNOSIS — M1712 Unilateral primary osteoarthritis, left knee: Secondary | ICD-10-CM | POA: Diagnosis present

## 2022-02-11 DIAGNOSIS — D696 Thrombocytopenia, unspecified: Secondary | ICD-10-CM

## 2022-02-11 DIAGNOSIS — Z96659 Presence of unspecified artificial knee joint: Secondary | ICD-10-CM

## 2022-02-11 DIAGNOSIS — N200 Calculus of kidney: Secondary | ICD-10-CM | POA: Insufficient documentation

## 2022-02-11 DIAGNOSIS — E538 Deficiency of other specified B group vitamins: Secondary | ICD-10-CM

## 2022-02-11 HISTORY — PX: KNEE ARTHROPLASTY: SHX992

## 2022-02-11 LAB — GLUCOSE, CAPILLARY: Glucose-Capillary: 150 mg/dL — ABNORMAL HIGH (ref 70–99)

## 2022-02-11 SURGERY — ARTHROPLASTY, KNEE, TOTAL, USING IMAGELESS COMPUTER-ASSISTED NAVIGATION
Anesthesia: Choice | Site: Knee | Laterality: Left

## 2022-02-11 MED ORDER — PROPOFOL 10 MG/ML IV BOLUS
INTRAVENOUS | Status: DC | PRN
  Administered 2022-02-11: 30 mg via INTRAVENOUS

## 2022-02-11 MED ORDER — CEFAZOLIN SODIUM-DEXTROSE 2-4 GM/100ML-% IV SOLN
2.0000 g | Freq: Four times a day (QID) | INTRAVENOUS | Status: AC
  Administered 2022-02-11 (×2): 2 g via INTRAVENOUS
  Filled 2022-02-11 (×2): qty 100

## 2022-02-11 MED ORDER — ACETAMINOPHEN 10 MG/ML IV SOLN
INTRAVENOUS | Status: AC
  Filled 2022-02-11: qty 100

## 2022-02-11 MED ORDER — IRRISEPT - 450ML BOTTLE WITH 0.05% CHG IN STERILE WATER, USP 99.95% OPTIME
TOPICAL | Status: DC | PRN
  Administered 2022-02-11: 450 mL

## 2022-02-11 MED ORDER — TRANEXAMIC ACID-NACL 1000-0.7 MG/100ML-% IV SOLN
INTRAVENOUS | Status: DC | PRN
  Administered 2022-02-11: 1000 mg via INTRAVENOUS

## 2022-02-11 MED ORDER — CELECOXIB 200 MG PO CAPS
200.0000 mg | ORAL_CAPSULE | Freq: Two times a day (BID) | ORAL | Status: DC
  Administered 2022-02-11 – 2022-02-12 (×2): 200 mg via ORAL
  Filled 2022-02-11 (×2): qty 1

## 2022-02-11 MED ORDER — PROPOFOL 1000 MG/100ML IV EMUL
INTRAVENOUS | Status: AC
  Filled 2022-02-11: qty 100

## 2022-02-11 MED ORDER — PROMETHAZINE HCL 25 MG/ML IJ SOLN: 6.2500 mg | INTRAMUSCULAR | Status: DC | PRN

## 2022-02-11 MED ORDER — ENOXAPARIN SODIUM 30 MG/0.3ML IJ SOSY
30.0000 mg | PREFILLED_SYRINGE | Freq: Two times a day (BID) | INTRAMUSCULAR | Status: DC
  Administered 2022-02-12: 30 mg via SUBCUTANEOUS
  Filled 2022-02-11: qty 0.3

## 2022-02-11 MED ORDER — EPHEDRINE SULFATE (PRESSORS) 50 MG/ML IJ SOLN
INTRAMUSCULAR | Status: DC | PRN
  Administered 2022-02-11: 5 mg via INTRAVENOUS

## 2022-02-11 MED ORDER — OXYCODONE HCL 5 MG/5ML PO SOLN: 5.0000 mg | Freq: Once | ORAL | Status: DC | PRN

## 2022-02-11 MED ORDER — ENSURE PRE-SURGERY PO LIQD
296.0000 mL | Freq: Once | ORAL | Status: AC
  Administered 2022-02-11: 296 mL via ORAL

## 2022-02-11 MED ORDER — BUPIVACAINE HCL (PF) 0.25 % IJ SOLN
INTRAMUSCULAR | Status: AC
  Filled 2022-02-11: qty 60

## 2022-02-11 MED ORDER — FENTANYL CITRATE (PF) 100 MCG/2ML IJ SOLN: 25.0000 ug | INTRAMUSCULAR | Status: DC | PRN

## 2022-02-11 MED ORDER — FAMOTIDINE 20 MG PO TABS
ORAL_TABLET | ORAL | Status: AC
  Administered 2022-02-11: 20 mg via ORAL
  Filled 2022-02-11: qty 1

## 2022-02-11 MED ORDER — BUPIVACAINE HCL (PF) 0.5 % IJ SOLN
INTRAMUSCULAR | Status: DC | PRN
  Administered 2022-02-11: 2.9 mL

## 2022-02-11 MED ORDER — TRANEXAMIC ACID-NACL 1000-0.7 MG/100ML-% IV SOLN: 1000.0000 mg | Freq: Once | INTRAVENOUS | Status: DC

## 2022-02-11 MED ORDER — PANTOPRAZOLE SODIUM 40 MG PO TBEC
40.0000 mg | DELAYED_RELEASE_TABLET | Freq: Two times a day (BID) | ORAL | Status: DC
  Administered 2022-02-11 – 2022-02-12 (×3): 40 mg via ORAL
  Filled 2022-02-11 (×3): qty 1

## 2022-02-11 MED ORDER — CHLORHEXIDINE GLUCONATE 0.12 % MT SOLN
OROMUCOSAL | Status: AC
  Administered 2022-02-11: 15 mL
  Filled 2022-02-11: qty 15

## 2022-02-11 MED ORDER — ALUM & MAG HYDROXIDE-SIMETH 200-200-20 MG/5ML PO SUSP: 30.0000 mL | ORAL | Status: DC | PRN

## 2022-02-11 MED ORDER — FLEET ENEMA 7-19 GM/118ML RE ENEM: 1.0000 | ENEMA | Freq: Once | RECTAL | Status: DC | PRN

## 2022-02-11 MED ORDER — MIDAZOLAM HCL 5 MG/5ML IJ SOLN
INTRAMUSCULAR | Status: DC | PRN
  Administered 2022-02-11: 2 mg via INTRAVENOUS

## 2022-02-11 MED ORDER — FENTANYL CITRATE (PF) 100 MCG/2ML IJ SOLN
INTRAMUSCULAR | Status: DC | PRN
  Administered 2022-02-11 (×2): 50 ug via INTRAVENOUS

## 2022-02-11 MED ORDER — METOCLOPRAMIDE HCL 5 MG PO TABS
10.0000 mg | ORAL_TABLET | Freq: Three times a day (TID) | ORAL | Status: DC
  Administered 2022-02-11 – 2022-02-12 (×3): 10 mg via ORAL
  Filled 2022-02-11 (×3): qty 2

## 2022-02-11 MED ORDER — ONDANSETRON HCL 4 MG/2ML IJ SOLN: 4.0000 mg | Freq: Four times a day (QID) | INTRAMUSCULAR | Status: DC | PRN

## 2022-02-11 MED ORDER — SODIUM CHLORIDE 0.9 % IV BOLUS
500.0000 mL | Freq: Once | INTRAVENOUS | Status: AC
  Administered 2022-02-11: 500 mL via INTRAVENOUS

## 2022-02-11 MED ORDER — BISACODYL 10 MG RE SUPP: 10.0000 mg | Freq: Every day | RECTAL | Status: DC | PRN

## 2022-02-11 MED ORDER — MAGNESIUM HYDROXIDE 400 MG/5ML PO SUSP
30.0000 mL | Freq: Every day | ORAL | Status: DC
  Administered 2022-02-11 – 2022-02-12 (×2): 30 mL via ORAL
  Filled 2022-02-11 (×2): qty 30

## 2022-02-11 MED ORDER — PHENOL 1.4 % MT LIQD: 1.0000 | OROMUCOSAL | Status: DC | PRN

## 2022-02-11 MED ORDER — GABAPENTIN 300 MG PO CAPS
ORAL_CAPSULE | ORAL | Status: AC
  Administered 2022-02-11: 300 mg
  Filled 2022-02-11: qty 1

## 2022-02-11 MED ORDER — DIPHENHYDRAMINE HCL 12.5 MG/5ML PO ELIX: 12.5000 mg | ORAL_SOLUTION | ORAL | Status: DC | PRN

## 2022-02-11 MED ORDER — MIDAZOLAM HCL 2 MG/2ML IJ SOLN
INTRAMUSCULAR | Status: AC
  Filled 2022-02-11: qty 2

## 2022-02-11 MED ORDER — TRAMADOL HCL 50 MG PO TABS
50.0000 mg | ORAL_TABLET | ORAL | Status: DC | PRN
  Administered 2022-02-11: 100 mg via ORAL
  Administered 2022-02-11: 50 mg via ORAL
  Filled 2022-02-11: qty 2
  Filled 2022-02-11: qty 1

## 2022-02-11 MED ORDER — BUPIVACAINE HCL (PF) 0.5 % IJ SOLN
INTRAMUSCULAR | Status: AC
  Filled 2022-02-11: qty 10

## 2022-02-11 MED ORDER — BUPIVACAINE LIPOSOME 1.3 % IJ SUSP
INTRAMUSCULAR | Status: AC
  Filled 2022-02-11: qty 20

## 2022-02-11 MED ORDER — FERROUS SULFATE 325 (65 FE) MG PO TABS
325.0000 mg | ORAL_TABLET | Freq: Two times a day (BID) | ORAL | Status: DC
  Administered 2022-02-11 – 2022-02-12 (×2): 325 mg via ORAL
  Filled 2022-02-11 (×2): qty 1

## 2022-02-11 MED ORDER — EPINEPHRINE PF 1 MG/ML IJ SOLN
INTRAMUSCULAR | Status: AC
  Filled 2022-02-11: qty 1

## 2022-02-11 MED ORDER — ONDANSETRON HCL 4 MG PO TABS: 4.0000 mg | ORAL_TABLET | Freq: Four times a day (QID) | ORAL | Status: DC | PRN

## 2022-02-11 MED ORDER — CEFAZOLIN SODIUM-DEXTROSE 2-3 GM-%(50ML) IV SOLR
INTRAVENOUS | Status: DC | PRN
  Administered 2022-02-11: 2 g via INTRAVENOUS

## 2022-02-11 MED ORDER — SENNOSIDES-DOCUSATE SODIUM 8.6-50 MG PO TABS
1.0000 | ORAL_TABLET | Freq: Two times a day (BID) | ORAL | Status: DC
  Administered 2022-02-11 – 2022-02-12 (×3): 1 via ORAL
  Filled 2022-02-11 (×3): qty 1

## 2022-02-11 MED ORDER — ACETAMINOPHEN 325 MG PO TABS: 325.0000 mg | ORAL_TABLET | Freq: Four times a day (QID) | ORAL | Status: DC | PRN

## 2022-02-11 MED ORDER — ACETAMINOPHEN 10 MG/ML IV SOLN
INTRAVENOUS | Status: DC | PRN
  Administered 2022-02-11: 1000 mg via INTRAVENOUS

## 2022-02-11 MED ORDER — LACTATED RINGERS IV SOLN: INTRAVENOUS | Status: DC | PRN

## 2022-02-11 MED ORDER — ACETAMINOPHEN 10 MG/ML IV SOLN
1000.0000 mg | Freq: Four times a day (QID) | INTRAVENOUS | Status: DC
  Administered 2022-02-11 – 2022-02-12 (×3): 1000 mg via INTRAVENOUS
  Filled 2022-02-11 (×3): qty 100

## 2022-02-11 MED ORDER — OXYCODONE HCL 5 MG PO TABS: 5.0000 mg | ORAL_TABLET | Freq: Once | ORAL | Status: DC | PRN

## 2022-02-11 MED ORDER — OXYCODONE HCL 5 MG PO TABS: 5.0000 mg | ORAL_TABLET | ORAL | Status: DC | PRN

## 2022-02-11 MED ORDER — LEVOTHYROXINE SODIUM 50 MCG PO TABS
50.0000 ug | ORAL_TABLET | Freq: Every day | ORAL | Status: DC
  Administered 2022-02-12: 50 ug via ORAL
  Filled 2022-02-11: qty 1

## 2022-02-11 MED ORDER — SODIUM CHLORIDE (PF) 0.9 % IJ SOLN
INTRAMUSCULAR | Status: DC | PRN
  Administered 2022-02-11: 120 mL via INTRAMUSCULAR

## 2022-02-11 MED ORDER — FENTANYL CITRATE (PF) 100 MCG/2ML IJ SOLN
INTRAMUSCULAR | Status: AC
  Filled 2022-02-11: qty 2

## 2022-02-11 MED ORDER — MENTHOL 3 MG MT LOZG: 1.0000 | LOZENGE | OROMUCOSAL | Status: DC | PRN

## 2022-02-11 MED ORDER — TRANEXAMIC ACID-NACL 1000-0.7 MG/100ML-% IV SOLN
INTRAVENOUS | Status: AC
  Filled 2022-02-11: qty 100

## 2022-02-11 MED ORDER — OXYCODONE HCL 5 MG PO TABS: 10.0000 mg | ORAL_TABLET | ORAL | Status: DC | PRN

## 2022-02-11 MED ORDER — ACETAMINOPHEN 10 MG/ML IV SOLN: 1000.0000 mg | Freq: Once | INTRAVENOUS | Status: DC | PRN

## 2022-02-11 MED ORDER — SODIUM CHLORIDE 0.9 % IR SOLN
Status: DC | PRN
  Administered 2022-02-11: 3000 mL

## 2022-02-11 MED ORDER — CELECOXIB 200 MG PO CAPS
ORAL_CAPSULE | ORAL | Status: AC
  Administered 2022-02-11: 400 mg via ORAL
  Filled 2022-02-11: qty 2

## 2022-02-11 MED ORDER — DEXAMETHASONE SODIUM PHOSPHATE 10 MG/ML IJ SOLN
INTRAMUSCULAR | Status: AC
  Administered 2022-02-11: 8 mg via INTRAVENOUS
  Filled 2022-02-11: qty 1

## 2022-02-11 MED ORDER — SODIUM CHLORIDE 0.9 % IV SOLN: INTRAVENOUS | Status: DC

## 2022-02-11 MED ORDER — SODIUM CHLORIDE FLUSH 0.9 % IV SOLN
INTRAVENOUS | Status: AC
  Filled 2022-02-11: qty 40

## 2022-02-11 MED ORDER — PROPOFOL 500 MG/50ML IV EMUL
INTRAVENOUS | Status: DC | PRN
  Administered 2022-02-11: 100 ug/kg/min via INTRAVENOUS

## 2022-02-11 MED ORDER — CEFAZOLIN SODIUM-DEXTROSE 2-4 GM/100ML-% IV SOLN
INTRAVENOUS | Status: AC
  Filled 2022-02-11: qty 100

## 2022-02-11 MED ORDER — HYDROMORPHONE HCL 1 MG/ML IJ SOLN: 0.5000 mg | INTRAMUSCULAR | Status: DC | PRN

## 2022-02-11 MED ORDER — DROPERIDOL 2.5 MG/ML IJ SOLN: 0.6250 mg | Freq: Once | INTRAMUSCULAR | Status: DC | PRN

## 2022-02-11 SURGICAL SUPPLY — 71 items
ATTUNE MED DOME PAT 41 KNEE (Knees) IMPLANT
ATTUNE PS FEM LT SZ 7 CEM KNEE (Femur) IMPLANT
ATTUNE PSRP INSR SZ7 6 KNEE (Insert) IMPLANT
BASE TIBIAL ROT PLAT SZ 10 KNE (Miscellaneous) IMPLANT
BATTERY INSTRU NAVIGATION (MISCELLANEOUS) ×4 IMPLANT
BLADE SAW 70X12.5 (BLADE) ×1 IMPLANT
BLADE SAW 90X13X1.19 OSCILLAT (BLADE) ×1 IMPLANT
BLADE SAW 90X25X1.19 OSCILLAT (BLADE) ×1 IMPLANT
CATH FOLEY 2WAY 12FR 5CC LATEX (CATHETERS) IMPLANT
CATH FOLEY 2WAY SIL 16X30 (CATHETERS) IMPLANT
CEMENT HV SMART SET (Cement) IMPLANT
COOLER POLAR GLACIER W/PUMP (MISCELLANEOUS) ×1 IMPLANT
CUFF TOURN SGL QUICK 24 (TOURNIQUET CUFF)
CUFF TOURN SGL QUICK 34 (TOURNIQUET CUFF)
CUFF TRNQT CYL 24X4X16.5-23 (TOURNIQUET CUFF) IMPLANT
CUFF TRNQT CYL 34X4.125X (TOURNIQUET CUFF) IMPLANT
DRAPE 3/4 80X56 (DRAPES) ×1 IMPLANT
DRAPE INCISE IOBAN 66X45 STRL (DRAPES) IMPLANT
DRSG MEPILEX SACRM 8.7X9.8 (GAUZE/BANDAGES/DRESSINGS) ×1 IMPLANT
DRSG NON-ADHERENT DERMACEA 3X4 (GAUZE/BANDAGES/DRESSINGS) ×1 IMPLANT
DRSG OPSITE POSTOP 4X14 (GAUZE/BANDAGES/DRESSINGS) ×1 IMPLANT
DRSG TEGADERM 4X4.75 (GAUZE/BANDAGES/DRESSINGS) ×1 IMPLANT
DURAPREP 26ML APPLICATOR (WOUND CARE) ×2 IMPLANT
ELECT CAUTERY BLADE 6.4 (BLADE) ×1 IMPLANT
ELECT REM PT RETURN 9FT ADLT (ELECTROSURGICAL) ×1
ELECTRODE REM PT RTRN 9FT ADLT (ELECTROSURGICAL) ×1 IMPLANT
EX-PIN ORTHOLOCK NAV 4X150 (PIN) ×2 IMPLANT
GLOVE BIOGEL M STRL SZ7.5 (GLOVE) ×2 IMPLANT
GLOVE SRG 8 PF TXTR STRL LF DI (GLOVE) ×1 IMPLANT
GLOVE SURG UNDER POLY LF SZ8 (GLOVE) ×1
GOWN STRL REUS W/ TWL LRG LVL3 (GOWN DISPOSABLE) ×1 IMPLANT
GOWN STRL REUS W/TWL LRG LVL3 (GOWN DISPOSABLE) ×1
GOWN TOGA ZIPPER T7+ PEEL AWAY (MISCELLANEOUS) ×2 IMPLANT
HEMOVAC 400CC 10FR (MISCELLANEOUS) ×1 IMPLANT
HOLDER FOLEY CATH W/STRAP (MISCELLANEOUS) ×1 IMPLANT
IV NS IRRIG 3000ML ARTHROMATIC (IV SOLUTION) ×1 IMPLANT
JET LAVAGE IRRISEPT WOUND (IRRIGATION / IRRIGATOR) ×1
KIT TURNOVER KIT A (KITS) ×1 IMPLANT
KNIFE SCULPS 14X20 (INSTRUMENTS) ×1 IMPLANT
LAVAGE JET IRRISEPT WOUND (IRRIGATION / IRRIGATOR) IMPLANT
MANIFOLD NEPTUNE II (INSTRUMENTS) ×2 IMPLANT
NDL SPNL 20GX3.5 QUINCKE YW (NEEDLE) ×2 IMPLANT
NEEDLE SPNL 20GX3.5 QUINCKE YW (NEEDLE) ×2 IMPLANT
NS IRRIG 500ML POUR BTL (IV SOLUTION) ×1 IMPLANT
PACK TOTAL KNEE (MISCELLANEOUS) ×1 IMPLANT
PAD ABD DERMACEA PRESS 5X9 (GAUZE/BANDAGES/DRESSINGS) ×2 IMPLANT
PAD WRAPON POLAR KNEE (MISCELLANEOUS) ×1 IMPLANT
PIN DRILL FIX HALF THREAD (BIT) ×2 IMPLANT
PIN FIXATION 1/8DIA X 3INL (PIN) ×1 IMPLANT
PULSAVAC PLUS IRRIG FAN TIP (DISPOSABLE) ×1
SOL PREP PVP 2OZ (MISCELLANEOUS) ×1
SOLUTION IRRIG SURGIPHOR (IV SOLUTION) ×1 IMPLANT
SOLUTION PREP PVP 2OZ (MISCELLANEOUS) ×1 IMPLANT
SPONGE DRAIN TRACH 4X4 STRL 2S (GAUZE/BANDAGES/DRESSINGS) ×1 IMPLANT
STAPLER SKIN PROX 35W (STAPLE) ×1 IMPLANT
STOCKINETTE IMPERV 14X48 (MISCELLANEOUS) ×1 IMPLANT
STRAP TIBIA SHORT (MISCELLANEOUS) ×1 IMPLANT
SUCTION FRAZIER HANDLE 10FR (MISCELLANEOUS) ×1
SUCTION TUBE FRAZIER 10FR DISP (MISCELLANEOUS) ×1 IMPLANT
SUT VIC AB 0 CT1 36 (SUTURE) ×1 IMPLANT
SUT VIC AB 1 CT1 36 (SUTURE) ×2 IMPLANT
SUT VIC AB 2-0 CT2 27 (SUTURE) ×1 IMPLANT
SYR 30ML LL (SYRINGE) ×2 IMPLANT
TIBIAL BASE ROT PLAT SZ 10 KNE (Miscellaneous) ×1 IMPLANT
TIP FAN IRRIG PULSAVAC PLUS (DISPOSABLE) ×1 IMPLANT
TOWEL OR 17X26 4PK STRL BLUE (TOWEL DISPOSABLE) IMPLANT
TOWER CARTRIDGE SMART MIX (DISPOSABLE) ×1 IMPLANT
TRAP FLUID SMOKE EVACUATOR (MISCELLANEOUS) ×1 IMPLANT
TRAY FOLEY MTR SLVR 16FR STAT (SET/KITS/TRAYS/PACK) ×1 IMPLANT
WATER STERILE IRR 1000ML POUR (IV SOLUTION) ×1 IMPLANT
WRAPON POLAR PAD KNEE (MISCELLANEOUS) ×1

## 2022-02-11 NOTE — Progress Notes (Signed)
Patient arrived to pacu, awake/alert x4. Able to move bil lower ext, pulses intact. Spinal worn off. No c/o's at this time. Indwelling foley placed by urology, do not remove 3-7days per provider.

## 2022-02-11 NOTE — Anesthesia Preprocedure Evaluation (Addendum)
Anesthesia Evaluation  Patient identified by MRN, date of birth, ID band Patient awake    Reviewed: Allergy & Precautions, NPO status , Patient's Chart, lab work & pertinent test results, reviewed documented beta blocker date and time   History of Anesthesia Complications Negative for: history of anesthetic complications (no family hx)  Airway Mallampati: II  TM Distance: >3 FB Neck ROM: Full    Dental  (+) Missing, Caps   Pulmonary neg pulmonary ROS   Pulmonary exam normal        Cardiovascular Exercise Tolerance: Good negative cardio ROS Normal cardiovascular exam     Neuro/Psych negative neurological ROS  negative psych ROS   GI/Hepatic negative GI ROS, Neg liver ROS,,,  Endo/Other  Hypothyroidism    Renal/GU negative Renal ROS  negative genitourinary   Musculoskeletal  (+) Arthritis ,    Abdominal Normal abdominal exam  (+)   Peds  Hematology negative hematology ROS (+)   Anesthesia Other Findings EKG 08/14/20 brady sinus  2019 - MAC 3 grade 1 view  Reproductive/Obstetrics                             Anesthesia Physical Anesthesia Plan  ASA: 2  Anesthesia Plan: General/Spinal   Post-op Pain Management: Regional block* and Ofirmev IV (intra-op)*   Induction:   PONV Risk Score and Plan: 2  Airway Management Planned: Natural Airway  Additional Equipment:   Intra-op Plan:   Post-operative Plan:   Informed Consent: I have reviewed the patients History and Physical, chart, labs and discussed the procedure including the risks, benefits and alternatives for the proposed anesthesia with the patient or authorized representative who has indicated his/her understanding and acceptance.       Plan Discussed with: CRNA  Anesthesia Plan Comments:         Anesthesia Quick Evaluation

## 2022-02-11 NOTE — Discharge Summary (Incomplete)
Physician Discharge Summary  Patient ID: Justin Lara MRN: [id number] DOB/AGE: 1955-10-18 67 y.o.  Admit date: 02/11/2022 Discharge date: 02/12/2022  Admission Diagnoses:  Total knee replacement status [Z96.659]   Discharge Diagnoses: Patient Active Problem List   Diagnosis Date Noted   Kidney stone 02/11/2022   Total knee replacement status 02/11/2022   Primary osteoarthritis of left knee 05/23/2021   Elevated PSA 11/11/2020   B12 deficiency 01/27/2015   Chronic osteoarthritis 01/23/2015   Thrombocytopenia (HCC) 01/23/2015   Hypothyroid 01/21/2014   Pure hypercholesterolemia 01/21/2014    Past Medical History:  Diagnosis Date   Arthritis    Blood dyscrasia    thrombocytopenia   Deficiency of other specified B group vitamins (CODE)    b 12   History of kidney stones    Hypothyroidism    Thrombocytopenia (HCC) 2017     Transfusion: none   Consultants (if any):   Discharged Condition: Improved  Hospital Course: Justin Lara is an 67 y.o. male who was admitted 02/11/2022 with a diagnosis of Total knee replacement status and went to the operating room on 02/11/2022 and underwent the above named procedures.    Surgeries: Procedure(s): COMPUTER ASSISTED TOTAL KNEE ARTHROPLASTY - RNFA on 02/11/2022 Patient tolerated the surgery well. Taken to PACU where she was stabilized and then transferred to the orthopedic floor.  Started on Lovenox 30 mg q 12 hrs. TEDs and SCDs applied bilaterally. Heels elevated on bed. No evidence of DVT. Negative Homan. Physical therapy started on day #1 for gait training and transfer. OT started day #1 for ADL and assisted devices. Patient's IV and hemovac was d/c on day #1. Patient was able to safely and independently complete all PT goals. PT recommending discharge to home. On post op day #1patient was stable and ready for discharge to home with HHPT.  Implants: DePuy Attune size 7 posterior stabilized femoral component (cemented), size 10  rotating platform tibial component (cemented), 41 mm medialized dome patella (cemented), and a 6 mm stabilized rotating platform polyethylene insert.   He was given perioperative antibiotics:  Anti-infectives (From admission, onward)    Start     Dose/Rate Route Frequency Ordered Stop   02/11/22 1345  ceFAZolin (ANCEF) IVPB 2g/100 mL premix        2 g 200 mL/hr over 30 Minutes Intravenous Every 6 hours 02/11/22 1255 02/11/22 2112   02/11/22 0610  ceFAZolin (ANCEF) 2-4 GM/100ML-% IVPB       Note to Pharmacy: Stefano Gaul H: cabinet override      02/11/22 0610 02/11/22 1814   02/11/22 0600  ceFAZolin (ANCEF) IVPB 2g/100 mL premix  Status:  Discontinued        2 g 200 mL/hr over 30 Minutes Intravenous On call to O.R. 02/10/22 2320 02/11/22 9562     .  He was given sequential compression devices, early ambulation, and Lovenox, TEDs for DVT prophylaxis.  He benefited maximally from the hospital stay and there were no complications.    Recent vital signs:  Vitals:   02/11/22 1714 02/11/22 2358  BP: 108/68 107/63  Pulse: 70 (!) 52  Resp:  18  Temp:  97.6 F (36.4 C)  SpO2:  96%    Recent laboratory studies:  Lab Results  Component Value Date   HGB 16.6 12/21/2021   HGB 15.6 11/26/2015   Lab Results  Component Value Date   WBC 5.1 12/21/2021   PLT 161 12/21/2021   Lab Results  Component Value Date  INR 1.0 11/15/2018   Lab Results  Component Value Date   NA 140 12/21/2021   K 4.3 12/21/2021   CL 106 12/21/2021   CO2 27 12/21/2021   BUN 19 12/21/2021   CREATININE 1.07 12/21/2021   GLUCOSE 81 12/21/2021    Discharge Medications:   Allergies as of 02/12/2022   No Known Allergies      Medication List     STOP taking these medications    ibuprofen 200 MG tablet Commonly known as: ADVIL       TAKE these medications    acetaminophen 325 MG tablet Commonly known as: TYLENOL Take 1-2 tablets (325-650 mg total) by mouth every 6 (six) hours as  needed for mild pain (pain score 1-3 or temp > 100.5).   celecoxib 200 MG capsule Commonly known as: CELEBREX Take 1 capsule (200 mg total) by mouth 2 (two) times daily for 14 days.   cholecalciferol 25 MCG (1000 UNIT) tablet Commonly known as: VITAMIN D3 Take 4,000 Units by mouth daily with supper.   Cyanocobalamin 1000 MCG Tbcr Take 1,000 mcg by mouth daily before breakfast.   enoxaparin 40 MG/0.4ML injection Commonly known as: LOVENOX Inject 0.4 mLs (40 mg total) into the skin daily for 14 days.   levothyroxine 25 MCG tablet Commonly known as: SYNTHROID Take 50 mcg by mouth daily before breakfast.   ondansetron 4 MG tablet Commonly known as: ZOFRAN Take 1 tablet (4 mg total) by mouth every 6 (six) hours as needed for nausea.   oxyCODONE 5 MG immediate release tablet Commonly known as: Oxy IR/ROXICODONE Take 1 tablet (5 mg total) by mouth every 6 (six) hours as needed for moderate pain (pain score 4-6).   senna-docusate 8.6-50 MG tablet Commonly known as: Senokot-S Take 1 tablet by mouth 2 (two) times daily.   traMADol 50 MG tablet Commonly known as: ULTRAM Take 1-2 tablets (50-100 mg total) by mouth every 6 (six) hours as needed for moderate pain.               Durable Medical Equipment  (From admission, onward)           Start     Ordered   02/11/22 1542  For home use only DME Walker rolling  Once       Question Answer Comment  Walker: With [place] Wheels   Patient needs a walker to treat with the following condition General weakness      02/11/22 1541   02/11/22 1255  DME Walker rolling  Once       Question:  Patient needs a walker to treat with the following condition  Answer:  Total knee replacement status   02/11/22 1254   02/11/22 1255  DME Bedside commode  Once       Comments: Patient is not able to walk the distance required to go the bathroom, or he/she is unable to safely negotiate stairs required to access the bathroom.  A 3in1 BSC will  alleviate this problem  Question:  Patient needs a bedside commode to treat with the following condition  Answer:  Total knee replacement status   02/11/22 1254            Diagnostic Studies: DG Knee Left Port  Result Date: 02/11/2022 CLINICAL DATA:  Left knee surgery EXAM: PORTABLE LEFT KNEE - 1-2 VIEW COMPARISON:  09/14/2015 FINDINGS: Interval postsurgical changes from left total knee arthroplasty. Arthroplasty components are in their expected alignment. No periprosthetic fracture or evidence  of other complication. Expected postoperative changes within the overlying soft tissues. IMPRESSION: Interval left total knee arthroplasty without evidence of postoperative complication. Electronically Signed   By: Davina Poke D.O.   On: 02/11/2022 11:35    Disposition:      Follow-up Information     [name], PA Follow up on 02/28/2022.   Specialty: Physician Assistant Why: at 9:15am Contact information: [place] [address] [phone]         Dereck Leep, MD Follow up on 03/24/2022.   Specialty: Orthopedic Surgery Why: at 2:45pm Contact information: [place] [address] [phone]                  Signed: Feliberto Gottron 02/12/2022, 8:28 AM

## 2022-02-11 NOTE — Progress Notes (Signed)
   UROLOGY PROCEDURE NOTE  Indication: Nursing unable to place Foley, urology consulted for Foley placement  Former patient of Dr. Letta Kocher, those records are unavailable to me, hypospadias on exam and nursing unable to place Foley prior to orthopedic procedure with Dr. Marry Guan.  Patient was intubated and sedated when I arrived.  The patient was prepped and draped in standard sterile fashion.  On exam, circumcised phallus with hypospadias with some changes consistent with mild BXO.  I used a hemostat to gently dilate the urethra distally, and was able to pass a 14 [place] silicone Foley with only mild resistance into the bladder.  Urine of clear yellow urine was noted.  10 cc were placed in the balloon, the catheter was connected to dependent drainage, and the Foley was secured to the thigh.  Plan: -Recommend Foley 3 to 7 days, will coordinate urology follow-up for voiding trial -If patient not tolerating the catheter, can remove tomorrow morning, but would need to void prior to discharge   Nickolas Madrid, MD 02/11/2022

## 2022-02-11 NOTE — Transfer of Care (Signed)
Immediate Anesthesia Transfer of Care Note  Patient: Justin Lara  Procedure(s) Performed: COMPUTER ASSISTED TOTAL KNEE ARTHROPLASTY - RNFA (Left: Knee)  Patient Location: PACU  Anesthesia Type:Spinal  Level of Consciousness: awake, alert , and oriented  Airway & Oxygen Therapy: Patient Spontanous Breathing  Post-op Assessment: Report given to RN and Post -op Vital signs reviewed and stable  Post vital signs: Reviewed and stable  Last Vitals:  Vitals Value Taken Time  BP 130/67 02/11/22 1115  Temp    Pulse 68 02/11/22 1116  Resp 13 02/11/22 1116  SpO2 96 % 02/11/22 1116  Vitals shown include unvalidated device data.  Last Pain:  Vitals:   02/11/22 0624  TempSrc: Oral         Complications: No notable events documented.

## 2022-02-11 NOTE — Progress Notes (Signed)
While transferring to bathroom patient had a dizzy spell and began to look lethargic, Nurse aide JD assisted patient to bed immediately. This nurse and charge nurse Bre RN assessed patient. Blood sugar and vital signs were taken. Blood sugar was WNL,  Blood pressure was lower than normal. Dr. Marry Guan was notified and orders were given and administered (see MAR). Patient vitals are now WNL.

## 2022-02-11 NOTE — Discharge Summary (Incomplete Revision)
Physician Discharge Summary  Patient ID: Justin Lara MRN: [id number] DOB/AGE: 1955-08-26 67 y.o.  Admit date: 02/11/2022 Discharge date: 02/12/2022  Admission Diagnoses:  Total knee replacement status [Z96.659]   Discharge Diagnoses: Patient Active Problem List   Diagnosis Date Noted   Kidney stone 02/11/2022   Total knee replacement status 02/11/2022   Primary osteoarthritis of left knee 05/23/2021   Elevated PSA 11/11/2020   B12 deficiency 01/27/2015   Chronic osteoarthritis 01/23/2015   Thrombocytopenia ([place]) 01/23/2015   Hypothyroid 01/21/2014   Pure hypercholesterolemia 01/21/2014    Past Medical History:  Diagnosis Date   Arthritis    Blood dyscrasia    thrombocytopenia   Deficiency of other specified B group vitamins (CODE)    b 12   History of kidney stones    Hypothyroidism    Thrombocytopenia ([place]) 2017     Transfusion: none   Consultants (if any):   Discharged Condition: Improved  Hospital Course: Justin Lara is an 67 y.o. male who was admitted 02/11/2022 with a diagnosis of Total knee replacement status and went to the operating room on 02/11/2022 and underwent the above named procedures.    Surgeries: Procedure(s): COMPUTER ASSISTED TOTAL KNEE ARTHROPLASTY - RNFA on 02/11/2022 Patient tolerated the surgery well. Taken to PACU where she was stabilized and then transferred to the orthopedic floor.  Started on Lovenox 30 mg q 12 hrs. TEDs and SCDs applied bilaterally. Heels elevated on bed. No evidence of DVT. Negative Homan.  Physical therapy started on day #1 for gait training and transfer. OT started day #1 for ADL and assisted devices. Patient's IV and hemovac was d/c on day #1. Patient was able to safely and independently complete all PT goals. PT recommending discharge to home. On post op day #1patient was stable and ready for discharge to home with HHPT.  Patient had foley placed by urologist prior to surgery due to hypospadias. Recommendation  was to keep foley in 3-7 days and follow up. Patient requesting foley be removed prior to discharge which is ok per urology as long as he can void prior to discharge. Patient able to void prior to discharge. Outpatient follow up order with urology placed.  Implants: DePuy Attune size 7 posterior stabilized femoral component (cemented), size 10 rotating platform tibial component (cemented), 41 mm medialized dome patella (cemented), and a 6 mm stabilized rotating platform polyethylene insert.   He was given perioperative antibiotics:  Anti-infectives (From admission, onward)    Start     Dose/Rate Route Frequency Ordered Stop   02/11/22 1345  ceFAZolin (ANCEF) IVPB 2g/100 mL premix        2 g 200 mL/hr over 30 Minutes Intravenous Every 6 hours 02/11/22 1255 02/11/22 2112   02/11/22 0610  ceFAZolin (ANCEF) 2-4 GM/100ML-% IVPB       Note to Pharmacy: Jordan Hawks H: cabinet override      02/11/22 0610 02/11/22 1814   02/11/22 0600  ceFAZolin (ANCEF) IVPB 2g/100 mL premix  Status:  Discontinued        2 g 200 mL/hr over 30 Minutes Intravenous On call to O.R. 02/10/22 2320 02/11/22 9242     .  He was given sequential compression devices, early ambulation, and Lovenox, TEDs for DVT prophylaxis.  He benefited maximally from the hospital stay and there were no complications.    Recent vital signs:  Vitals:   02/11/22 1714 02/11/22 2358  BP: 108/68 107/63  Pulse: 70 (!) 52  Resp:  18  Temp:  97.6 F (36.4 C)  SpO2:  96%    Recent laboratory studies:  Lab Results  Component Value Date   HGB 16.6 12/21/2021   HGB 15.6 11/26/2015   Lab Results  Component Value Date   WBC 5.1 12/21/2021   PLT 161 12/21/2021   Lab Results  Component Value Date   INR 1.0 11/15/2018   Lab Results  Component Value Date   NA 140 12/21/2021   K 4.3 12/21/2021   CL 106 12/21/2021   CO2 27 12/21/2021   BUN 19 12/21/2021   CREATININE 1.07 12/21/2021   GLUCOSE 81 12/21/2021    Discharge  Medications:   Allergies as of 02/12/2022   No Known Allergies      Medication List     STOP taking these medications    ibuprofen 200 MG tablet Commonly known as: ADVIL       TAKE these medications    acetaminophen 325 MG tablet Commonly known as: TYLENOL Take 1-2 tablets (325-650 mg total) by mouth every 6 (six) hours as needed for mild pain (pain score 1-3 or temp > 100.5).   celecoxib 200 MG capsule Commonly known as: CELEBREX Take 1 capsule (200 mg total) by mouth 2 (two) times daily for 14 days.   cholecalciferol 25 MCG (1000 UNIT) tablet Commonly known as: VITAMIN D3 Take 4,000 Units by mouth daily with supper.   Cyanocobalamin 1000 MCG Tbcr Take 1,000 mcg by mouth daily before breakfast.   enoxaparin 40 MG/0.4ML injection Commonly known as: LOVENOX Inject 0.4 mLs (40 mg total) into the skin daily for 14 days.   levothyroxine 25 MCG tablet Commonly known as: SYNTHROID Take 50 mcg by mouth daily before breakfast.   ondansetron 4 MG tablet Commonly known as: ZOFRAN Take 1 tablet (4 mg total) by mouth every 6 (six) hours as needed for nausea.   oxyCODONE 5 MG immediate release tablet Commonly known as: Oxy IR/ROXICODONE Take 1 tablet (5 mg total) by mouth every 6 (six) hours as needed for moderate pain (pain score 4-6).   senna-docusate 8.6-50 MG tablet Commonly known as: Senokot-S Take 1 tablet by mouth 2 (two) times daily.   traMADol 50 MG tablet Commonly known as: ULTRAM Take 1-2 tablets (50-100 mg total) by mouth every 6 (six) hours as needed for moderate pain.               Durable Medical Equipment  (From admission, onward)           Start     Ordered   02/11/22 1542  For home use only DME Walker rolling  Once       Question Answer Comment  Walker: With [place] Wheels   Patient needs a walker to treat with the following condition General weakness      02/11/22 1541   02/11/22 1255  DME Walker rolling  Once       Question:   Patient needs a walker to treat with the following condition  Answer:  Total knee replacement status   02/11/22 1254   02/11/22 1255  DME Bedside commode  Once       Comments: Patient is not able to walk the distance required to go the bathroom, or he/she is unable to safely negotiate stairs required to access the bathroom.  A 3in1 BSC will alleviate this problem  Question:  Patient needs a bedside commode to treat with the following condition  Answer:  Total knee replacement status  02/11/22 1254            Diagnostic Studies: DG Knee Left Port  Result Date: 02/11/2022 CLINICAL DATA:  Left knee surgery EXAM: PORTABLE LEFT KNEE - 1-2 VIEW COMPARISON:  09/14/2015 FINDINGS: Interval postsurgical changes from left total knee arthroplasty. Arthroplasty components are in their expected alignment. No periprosthetic fracture or evidence of other complication. Expected postoperative changes within the overlying soft tissues. IMPRESSION: Interval left total knee arthroplasty without evidence of postoperative complication. Electronically Signed   By: Duanne Guess D.O.   On: 02/11/2022 11:35    Disposition:      Follow-up Information     Tera Partridge, PA Follow up on 02/28/2022.   Specialty: Physician Assistant Why: at 9:15am Contact information: [address] [place] [address] [phone]         Donato Heinz, MD Follow up on 03/24/2022.   Specialty: Orthopedic Surgery Why: at 2:45pm Contact information: [address] [facility] [place] [address] [phone]                  Signed: Patience Musca 02/12/2022, 8:28 AM

## 2022-02-11 NOTE — Interval H&P Note (Signed)
History and Physical Interval Note:  02/11/2022 6:03 AM  Justin Lara  has presented today for surgery, with the diagnosis of PRIMARY OSTEOARTHRITIS OF LEFT KNEE..  The various methods of treatment have been discussed with the patient and family. After consideration of risks, benefits and other options for treatment, the patient has consented to  Procedure(s): COMPUTER ASSISTED TOTAL KNEE ARTHROPLASTY - RNFA (Left) as a surgical intervention.  The patient's history has been reviewed, patient examined, no change in status, stable for surgery.  I have reviewed the patient's chart and labs.  Questions were answered to the patient's satisfaction.     [place]

## 2022-02-11 NOTE — Evaluation (Addendum)
Physical Therapy Evaluation Patient Details Name: Justin Lara MRN: [id number] DOB: Jun 21, 1955 Today's Date: 02/11/2022  History of Present Illness  Pt is a 67 y/o M admitted on 02/11/22 for scheduled L TKA. PMH: OA, chicken pox, thyroid disease  Clinical Impression  Pt seen for PT evaluation with pt agreeable, pt's wife present for session. Prior to admission pt was independent, working, & driving. PT provided pt with HEP handout & pt performs all exercises without physical assistance, cuing PRN for technique. Pt is able to complete bed mobility & STS without physical assistance, ambulate in hallway with RW & CGA. Anticipate pt will make good, steady progress with functional mobility.      Recommendations for follow up therapy are one component of a multi-disciplinary discharge planning process, led by the attending physician.  Recommendations may be updated based on patient status, additional functional criteria and insurance authorization.  Follow Up Recommendations Outpatient PT      Assistance Recommended at Discharge Intermittent Supervision/Assistance  Patient can return home with the following  A little help with walking and/or transfers;A little help with bathing/dressing/bathroom;Assistance with cooking/housework;Assist for transportation;Help with stairs or ramp for entrance    Equipment Recommendations Rolling walker (2 wheels)  Recommendations for Other Services       Functional Status Assessment Patient has had a recent decline in their functional status and demonstrates the ability to make significant improvements in function in a reasonable and predictable amount of time.     Precautions / Restrictions Precautions Precautions: Fall Restrictions Weight Bearing Restrictions: Yes LLE Weight Bearing: Weight bearing as tolerated      Mobility  Bed Mobility Overal bed mobility: Needs Assistance Bed Mobility: Supine to Sit     Supine to sit: Modified independent  (Device/Increase time), HOB elevated          Transfers Overall transfer level: Needs assistance Equipment used: Rollator (4 wheels) Transfers: Sit to/from Stand Sit to Stand: Supervision           General transfer comment: STS from EOB with RW & cuing for hand placement/technique    Ambulation/Gait Ambulation/Gait assistance: Min guard Gait Distance (Feet): 40 Feet Assistive device: Rolling walker (2 wheels) Gait Pattern/deviations: Step-through pattern, Decreased dorsiflexion - right, Decreased dorsiflexion - left, Decreased stride length Gait velocity: decreased     General Gait Details: cuing to ambulate within base of AD, extend BLE kneeds, upright posture  Stairs            Wheelchair Mobility    Modified Rankin (Stroke Patients Only)       Balance Overall balance assessment: Mild deficits observed, not formally tested Sitting-balance support: Feet supported, No upper extremity supported Sitting balance-Leahy Scale: Good     Standing balance support: Bilateral upper extremity supported, During functional activity Standing balance-Leahy Scale: Fair                               Pertinent Vitals/Pain Pain Assessment Pain Assessment: Faces Faces Pain Scale: Hurts a little bit Pain Location: L knee Pain Descriptors / Indicators: Discomfort, Grimacing Pain Intervention(s): Monitored during session (polar care applied at end of session)    Home Living Family/patient expects to be discharged to:: Private residence Living Arrangements: Spouse/significant other Available Help at Discharge: Family;Available PRN/intermittently Type of Home: House Home Access: Stairs to enter Entrance Stairs-Rails: None Entrance Stairs-Number of Steps: 2 Alternate Level Stairs-Number of Steps: 7 steps to access bedroom Home Layout:  Multi-level (split level home) Home Equipment: Rollator (4 wheels)      Prior Function Prior Level of Function :  Independent/Modified Independent;Driving;Working/employed             Mobility Comments: Independent without AD, denies falls       Hand Dominance        Extremity/Trunk Assessment   Upper Extremity Assessment Upper Extremity Assessment: Overall WFL for tasks assessed    Lower Extremity Assessment Lower Extremity Assessment: Generalized weakness;LLE deficits/detail LLE Deficits / Details: 3/5 knee extension in sitting; pt reports L foot/toes point straight ahead vs rotated inward like prior to sx       Communication   Communication: No difficulties  Cognition Arousal/Alertness: Awake/alert Behavior During Therapy: WFL for tasks assessed/performed Overall Cognitive Status: Within Functional Limits for tasks assessed                                          General Comments General comments (skin integrity, edema, etc.): Educated pt & wife on use of polar care, PT f/u, and DME recommendations, reviewed use of incentive spirometer with pt demonstrating good ability to use device.    Exercises Total Joint Exercises Ankle Circles/Pumps: AROM, Left, 10 reps, Supine Quad Sets: AROM, Supine, Strengthening, Left, 10 reps Short Arc Quad: AROM, Strengthening, Supine, Left, 10 reps Heel Slides: AROM, Supine, Left, 10 reps, Strengthening Hip ABduction/ADduction: AROM, Supine, Strengthening, Left, 10 reps (hip abduction slides) Straight Leg Raises: AROM, Supine, Strengthening, Left, 10 reps Long Arc Quad: AROM, Seated, Strengthening, Left, 10 reps Knee Flexion: AROM, Seated, 10 reps, Left (towel under foot to reduce friction) Goniometric ROM: ~5 degrees from full extension, ~90 degrees flexion in sitting   Assessment/Plan    PT Assessment Patient needs continued PT services  PT Problem List Decreased strength;Pain;Decreased range of motion;Decreased activity tolerance;Decreased balance;Decreased mobility;Decreased knowledge of use of DME;Decreased safety  awareness       PT Treatment Interventions Therapeutic exercise;Balance training;Gait training;DME instruction;Stair training;Neuromuscular re-education;Functional mobility training;Patient/family education;Therapeutic activities;Modalities;Manual techniques    PT Goals (Current goals can be found in the Care Plan section)  Acute Rehab PT Goals Patient Stated Goal: return to PLOF PT Goal Formulation: With patient Time For Goal Achievement: 02/25/22 Potential to Achieve Goals: Good    Frequency BID     Co-evaluation               AM-PAC PT "6 Clicks" Mobility  Outcome Measure Help needed turning from your back to your side while in a flat bed without using bedrails?: None Help needed moving from lying on your back to sitting on the side of a flat bed without using bedrails?: A Little Help needed moving to and from a bed to a chair (including a wheelchair)?: A Little Help needed standing up from a chair using your arms (e.g., wheelchair or bedside chair)?: A Little Help needed to walk in hospital room?: A Little Help needed climbing 3-5 steps with a railing? : A Little 6 Click Score: 19    End of Session   Activity Tolerance: Patient tolerated treatment well Patient left: in chair;with call bell/phone within reach;with family/visitor present;with nursing/sitter in room (polar care on L knee) Nurse Communication: Mobility status PT Visit Diagnosis: Other abnormalities of gait and mobility (R26.89);Difficulty in walking, not elsewhere classified (R26.2);Muscle weakness (generalized) (M62.81);Pain Pain - Right/Left: Left Pain - part of body: Knee  Time: 4332-9518 PT Time Calculation (min) (ACUTE ONLY): 28 min   Charges:   PT Evaluation $PT Eval Low Complexity: 1 Low PT Treatments $Therapeutic Exercise: 8-22 mins        Aleda Grana, PT, DPT 02/11/22, 3:48 PM   Sandi Mariscal 02/11/2022, 3:40 PM

## 2022-02-11 NOTE — Anesthesia Procedure Notes (Signed)
Spinal  Patient location during procedure: OR Start time: 02/11/2022 7:20 AM Reason for block: surgical anesthesia Staffing Performed: resident/CRNA  Resident/CRNA: Annaleigha Woo, Einar Grad, CRNA Performed by: Chanetta Marshall, CRNA Authorized by: Iran Ouch, MD   Preanesthetic Checklist Completed: patient identified, IV checked, site marked, risks and benefits discussed, surgical consent, monitors and equipment checked, pre-op evaluation and timeout performed Spinal Block Patient position: sitting Prep: DuraPrep Patient monitoring: heart rate, cardiac monitor, continuous pulse ox and blood pressure Approach: midline Location: L3-4 Injection technique: single-shot Needle Needle type: Sprotte  Needle gauge: 24 G Needle length: 9 cm Assessment Sensory level: T4 Events: CSF return Additional Notes + clear CSF, brisk flow, - heme, - parathesia, pt tolerated well

## 2022-02-11 NOTE — Op Note (Signed)
OPERATIVE NOTE  DATE OF SURGERY:  02/11/2022  PATIENT NAME:  Justin Lara   DOB: Sep 13, 1955  MRN: [id number]  PRE-OPERATIVE DIAGNOSIS: Degenerative arthrosis of the left knee, primary  POST-OPERATIVE DIAGNOSIS:  Same  PROCEDURE:  Left total knee arthroplasty using computer-assisted navigation  SURGEON:  Marciano Sequin. M.D.  ANESTHESIA: spinal  ESTIMATED BLOOD LOSS: 50 mL  FLUIDS REPLACED: 900 mL of crystalloid  TOURNIQUET TIME: 90 minutes  DRAINS: 2 medium Hemovac drains  SOFT TISSUE RELEASES: Anterior cruciate ligament, posterior cruciate ligament, deep medial collateral ligament, patellofemoral ligament  IMPLANTS UTILIZED: DePuy Attune size 7 posterior stabilized femoral component (cemented), size 10 rotating platform tibial component (cemented), 41 mm medialized dome patella (cemented), and a 6 mm stabilized rotating platform polyethylene insert.  INDICATIONS FOR SURGERY: Justin Lara is a 67 y.o. year old male with a long history of progressive knee pain. X-rays demonstrated severe degenerative changes in tricompartmental fashion. The patient had not seen any significant improvement despite conservative nonsurgical intervention. After discussion of the risks and benefits of surgical intervention, the patient expressed understanding of the risks benefits and agree with plans for total knee arthroplasty.   The risks, benefits, and alternatives were discussed at length including but not limited to the risks of infection, bleeding, nerve injury, stiffness, blood clots, the need for revision surgery, cardiopulmonary complications, among others, and they were willing to proceed.  PROCEDURE IN DETAIL: The patient was brought into the operating room and, after adequate spinal anesthesia was achieved, a tourniquet was placed on the patient's upper thigh. The patient's knee and leg were cleaned and prepped with alcohol and DuraPrep and draped in the usual sterile fashion. A  "timeout" was performed as per usual protocol. The lower extremity was exsanguinated using an Esmarch, and the tourniquet was inflated to 300 mmHg. An anterior longitudinal incision was made followed by a standard mid vastus approach. The deep fibers of the medial collateral ligament were elevated in a subperiosteal fashion off of the medial flare of the tibia so as to maintain a continuous soft tissue sleeve. The patella was subluxed laterally and the patellofemoral ligament was incised. Inspection of the knee demonstrated severe degenerative changes with full-thickness loss of articular cartilage. Osteophytes were debrided using a rongeur. Anterior and posterior cruciate ligaments were excised. Two 4.0 mm Schanz pins were inserted in the femur and into the tibia for attachment of the array of trackers used for computer-assisted navigation. Hip center was identified using a circumduction technique. Distal landmarks were mapped using the computer. The distal femur and proximal tibia were mapped using the computer. The distal femoral cutting guide was positioned using computer-assisted navigation so as to achieve a 5 distal valgus cut. The femur was sized and it was felt that a size 7 femoral component was appropriate. A size 7 femoral cutting guide was positioned and the anterior cut was performed and verified using the computer. This was followed by completion of the posterior and chamfer cuts. Femoral cutting guide for the central box was then positioned in the center box cut was performed.  Attention was then directed to the proximal tibia. Medial and lateral menisci were excised. The extramedullary tibial cutting guide was positioned using computer-assisted navigation so as to achieve a 0 varus-valgus alignment and 3 posterior slope. The cut was performed and verified using the computer. The proximal tibia was sized and it was felt that a size 10 tibial tray was appropriate. Tibial and femoral trials were  inserted followed  by insertion of a 6 mm polyethylene insert. This allowed for excellent mediolateral soft tissue balancing both in flexion and in full extension. Finally, the patella was cut and prepared so as to accommodate a 41 mm medialized dome patella.  The portion of the bipartite patella was excised.  A patella trial was placed and the knee was placed through a range of motion with excellent patellar tracking appreciated. The femoral trial was removed after debridement of posterior osteophytes. The central post-hole for the tibial component was reamed followed by insertion of a keel punch. Tibial trials were then removed. Cut surfaces of bone were irrigated with copious amounts of normal saline using pulsatile lavage and then suctioned dry. Polymethylmethacrylate cement was prepared in the usual fashion using a vacuum mixer. Cement was applied to the cut surface of the proximal tibia as well as along the undersurface of a size 10 rotating platform tibial component. Tibial component was positioned and impacted into place. Excess cement was removed using [employer] fast streamer. Cement was then applied to the cut surfaces of the femur as well as along the posterior flanges of the size 7 femoral component. The femoral component was positioned and impacted into place. Excess cement was removed using [employer] fast streamer. A 6 mm polyethylene trial was inserted and the knee was brought into full extension with steady axial compression applied. Finally, cement was applied to the backside of a 41 mm medialized dome patella and the patellar component was positioned and patellar clamp applied. Excess cement was removed using [employer] fast streamer. After adequate curing of the cement, the tourniquet was deflated after a total tourniquet time of 90 minutes. Hemostasis was achieved using electrocautery. The knee was irrigated with copious amounts of normal saline using pulsatile lavage followed by 450 ml of Irrisept and then suctioned dry.  20 mL of 1.3% Exparel and 60 mL of 0.25% Marcaine in 40 mL of normal saline was injected along the posterior capsule, medial and lateral gutters, and along the arthrotomy site. A 6 mm stabilized rotating platform polyethylene insert was inserted and the knee was placed through a range of motion with excellent mediolateral soft tissue balancing appreciated and excellent patellar tracking noted. 2 medium drains were placed in the wound bed and brought out through separate stab incisions. The medial parapatellar portion of the incision was reapproximated using interrupted sutures of #1 Vicryl. Subcutaneous tissue was approximated in layers using first #0 Vicryl followed #2-0 Vicryl. The skin was approximated with skin staples. A sterile dressing was applied.  The patient tolerated the procedure well and was transported to the recovery room in stable condition.    Robyne Matar P. Holley Bouche., M.D.

## 2022-02-11 NOTE — Plan of Care (Signed)
  Problem: Education: Goal: Knowledge of the prescribed therapeutic regimen will improve Outcome: Progressing   Problem: Activity: Goal: Ability to avoid complications of mobility impairment will improve Outcome: Progressing   Problem: Pain Management: Goal: Pain level will decrease with appropriate interventions Outcome: Progressing

## 2022-02-12 DIAGNOSIS — M1712 Unilateral primary osteoarthritis, left knee: Secondary | ICD-10-CM | POA: Diagnosis not present

## 2022-02-12 MED ORDER — ONDANSETRON HCL 4 MG PO TABS: 4.0000 mg | ORAL_TABLET | Freq: Four times a day (QID) | ORAL | 0 refills | Status: DC | PRN

## 2022-02-12 MED ORDER — CELECOXIB 200 MG PO CAPS: 200.0000 mg | ORAL_CAPSULE | Freq: Two times a day (BID) | ORAL | 0 refills | Status: AC

## 2022-02-12 MED ORDER — ACETAMINOPHEN 325 MG PO TABS: 325.0000 mg | ORAL_TABLET | Freq: Four times a day (QID) | ORAL | Status: DC | PRN

## 2022-02-12 MED ORDER — TRAMADOL HCL 50 MG PO TABS: 50.0000 mg | ORAL_TABLET | Freq: Four times a day (QID) | ORAL | 0 refills | Status: DC | PRN

## 2022-02-12 MED ORDER — OXYCODONE HCL 5 MG PO TABS: 5.0000 mg | ORAL_TABLET | Freq: Four times a day (QID) | ORAL | 0 refills | Status: DC | PRN

## 2022-02-12 MED ORDER — ENOXAPARIN SODIUM 40 MG/0.4ML IJ SOSY: 40.0000 mg | PREFILLED_SYRINGE | INTRAMUSCULAR | 0 refills | Status: DC

## 2022-02-12 MED ORDER — SENNOSIDES-DOCUSATE SODIUM 8.6-50 MG PO TABS: 1.0000 | ORAL_TABLET | Freq: Two times a day (BID) | ORAL | 0 refills | Status: DC

## 2022-02-12 NOTE — Progress Notes (Signed)
   Subjective: 1 Day Post-Op Procedure(s) (LRB): COMPUTER ASSISTED TOTAL KNEE ARTHROPLASTY - RNFA (Left) Patient reports pain as mild.   Patient is well, and has had no acute complaints or problems Denies any CP, SOB, ABD pain. We will continue therapy today.  Plan is to go Home after hospital stay.  Objective: Vital signs in last 24 hours: Temp:  [97.3 F (36.3 C)-98.2 F (36.8 C)] 97.6 F (36.4 C) (01/26 2358) Pulse Rate:  [52-75] 52 (01/26 2358) Resp:  [10-28] 18 (01/26 2358) BP: (86-130)/(52-76) 107/63 (01/26 2358) SpO2:  [94 %-97 %] 96 % (01/26 2358)  Intake/Output from previous day: 01/26 0701 - 01/27 0700 In: 3326.8 [P.O.:125; I.V.:2149.1; IV Piggyback:1052.7] Out: 1970 [WCBJS:2831; Drains:450; Blood:50] Intake/Output this shift: Total I/O In: 309.2 [I.V.:209.2; IV Piggyback:100] Out: -   No results for input(s): "HGB" in the last 72 hours. No results for input(s): "WBC", "RBC", "HCT", "PLT" in the last 72 hours. No results for input(s): "NA", "K", "CL", "CO2", "BUN", "CREATININE", "GLUCOSE", "CALCIUM" in the last 72 hours. No results for input(s): "LABPT", "INR" in the last 72 hours.  EXAM General - Patient is Alert, Appropriate, and Oriented Extremity - Neurovascular intact Sensation intact distally Intact pulses distally Dorsiflexion/Plantar flexion intact No cellulitis present Compartment soft Dressing - dressing C/D/I and no drainage, hemovac removed Motor Function - intact, moving foot and toes well on exam.   Past Medical History:  Diagnosis Date   Arthritis    Blood dyscrasia    thrombocytopenia   Deficiency of other specified B group vitamins (CODE)    b 12   History of kidney stones    Hypothyroidism    Thrombocytopenia ([place]) 2017    Assessment/Plan:   1 Day Post-Op Procedure(s) (LRB): COMPUTER ASSISTED TOTAL KNEE ARTHROPLASTY - RNFA (Left) Principal Problem:   Total knee replacement status  Estimated body mass index is 29.08 kg/m as  calculated from the following:   Height as of this encounter: 6' (1.829 m).   Weight as of this encounter: 97.3 kg. Advance diet Up with therapy Pain well controlled No complaints Patient successfully completed all PT goals this am. Discharge to home with HHPT today  DVT Prophylaxis - Lovenox, TED hose, and SCDs Weight-Bearing as tolerated to left leg   T. Rachelle Hora, PA-C [place] 02/12/2022, 8:24 AM

## 2022-02-12 NOTE — Plan of Care (Signed)
  Problem: Education: ?Goal: Knowledge of General Education information will improve ?Description: Including pain rating scale, medication(s)/side effects and non-pharmacologic comfort measures ?Outcome: Progressing ?  ?Problem: Health Behavior/Discharge Planning: ?Goal: Ability to manage health-related needs will improve ?Outcome: Progressing ?  ?Problem: Clinical Measurements: ?Goal: Ability to maintain clinical measurements within normal limits will improve ?Outcome: Progressing ?Goal: Diagnostic test results will improve ?Outcome: Progressing ?  ?Problem: Activity: ?Goal: Risk for activity intolerance will decrease ?Outcome: Progressing ?  ?Problem: Nutrition: ?Goal: Adequate nutrition will be maintained ?Outcome: Progressing ?  ?

## 2022-02-12 NOTE — TOC Initial Note (Signed)
Transition of Care [facility]) - Initial/Assessment Note    Patient Details  Name: Justin Lara MRN: [id number] Date of Birth: 1955/11/25  [facility]) CM/SW Contact:    Magnus Ivan, LCSW Phone Number: 02/12/2022, 8:57 AM  Clinical Narrative:                 Patient to DC home today. Patient was prearranged by [employer] with [place] and RW referral made to [facility] with [employer], notified her of DC home today pending DME delivery.   Expected Discharge Plan: [place] Barriers to Discharge: Barriers Resolved   Patient Goals and CMS Choice            Expected Discharge Plan and Services         Expected Discharge Date: 02/12/22               DME Arranged: 3-N-1, Walker rolling DME Agency: [employer] Date DME Agency Contacted: 02/12/22   Representative spoke with at DME Agency: Mardene Celeste HH Arranged: PT [place]: [place] Date [place]: 02/12/22   Representative spoke with at [place]: [place]  Prior Living Arrangements/Services                       Activities of Daily Living Home Assistive Devices/Equipment: None ADL Screening (condition at time of admission) Patient's cognitive ability adequate to safely complete daily activities?: Yes Is the patient deaf or have difficulty hearing?: No Does the patient have difficulty seeing, even when wearing glasses/contacts?: No Does the patient have difficulty concentrating, remembering, or making decisions?: No Patient able to express need for assistance with ADLs?: No Does the patient have difficulty dressing or bathing?: Yes Independently performs ADLs?: Yes (appropriate for developmental age) Does the patient have difficulty walking or climbing stairs?: No Weakness of Legs: None Weakness of Arms/Hands: None  Permission Sought/Granted                  Emotional Assessment              Admission diagnosis:   Total knee replacement status [Z96.659] Patient Active Problem List   Diagnosis Date Noted   Kidney stone 02/11/2022   Total knee replacement status 02/11/2022   Primary osteoarthritis of left knee 05/23/2021   Elevated PSA 11/11/2020   B12 deficiency 01/27/2015   Chronic osteoarthritis 01/23/2015   Thrombocytopenia ([place]) 01/23/2015   Hypothyroid 01/21/2014   Pure hypercholesterolemia 01/21/2014   PCP:  Adin Hector, MD Pharmacy:   [id number] - [place] - [address] [place] [address] Phone: [phone] Fax: [phone]     Social Determinants of Health (SDOH) Social History: SDOH Screenings   Food Insecurity: No Food Insecurity (02/11/2022)  Housing: Low Risk  (02/11/2022)  Transportation Needs: No Transportation Needs (02/11/2022)  Utilities: Not At Risk (02/11/2022)  Tobacco Use: Low Risk  (02/11/2022)   SDOH Interventions:     Readmission Risk Interventions     No data to display

## 2022-02-12 NOTE — Evaluation (Signed)
Occupational Therapy Evaluation Patient Details Name: Justin Lara MRN: [id number] DOB: [birth year] Today's Date: 02/12/2022   History of Present Illness Pt is a 67 y/o M admitted on 02/11/22 for scheduled L TKA. PMH: OA, chicken pox, thyroid disease   Clinical Impression   Pt up in chair willing to work with OT - very motivated to be discharge home with wife. Pt ind with supervision in room to sink and bathroom. Await delivery of BSC and RW. Pt Ind in LB dressing clothing management/toilet hygiene. Pt No LOB and no need for AE. Pt discharge at this time and no need for OT.     Recommendations for follow up therapy are one component of a multi-disciplinary discharge planning process, led by the attending physician.  Recommendations may be updated based on patient status, additional functional criteria and insurance authorization.   Follow Up Recommendations    HHPT    Assistance Recommended at Discharge  Wife at home PRN   Patient can return home with the following  HHPT    Functional Status Assessment     Equipment Recommendations    BSC and RW   Recommendations for Other Services       Precautions / Restrictions Precautions Precautions: Fall Restrictions Weight Bearing Restrictions: Yes LLE Weight Bearing: Weight bearing as tolerated      Mobility Bed Mobility                    Transfers   Equipment used: Rolling walker (2 wheels) Transfers: Sit to/from Stand Sit to Stand: Supervision           General transfer comment: supervistion - no LOB      Balance Overall balance assessment: Independent Sitting-balance support: Feet supported, No upper extremity supported Sitting balance-Leahy Scale: Normal     Standing balance support: No upper extremity supported Standing balance-Leahy Scale: Good                             ADL either performed or assessed with clinical judgement   ADL Overall ADL's : Independent                                        General ADL Comments: LB ADL's Ind with setup - no LOB - toiletting using RW to bathroom using BSC S ,     Vision Patient Visual Report: No change from baseline       Perception     Praxis      Pertinent Vitals/Pain Pain Assessment Pain Assessment: No/denies pain     Hand Dominance Right   Extremity/Trunk Assessment Upper Extremity Assessment Upper Extremity Assessment: Overall WFL for tasks assessed           Communication Communication Communication: No difficulties   Cognition Arousal/Alertness: Awake/alert Behavior During Therapy: WFL for tasks assessed/performed Overall Cognitive Status: Within Functional Limits for tasks assessed                                 General Comments: Pt is A and O x 4     General Comments       Exercises     Shoulder Instructions      Home Living Family/patient expects to be discharged to:: Private residence Living Arrangements: Spouse/significant other  Available Help at Discharge: Family;Available PRN/intermittently Type of Home: House Home Access: Stairs to enter     Home Layout: Multi-level Alternate Level Stairs-Number of Steps: 7 steps to access bedroom Alternate Level Stairs-Rails: Right           Home Equipment: Rollator (4 wheels)          Prior Functioning/Environment Prior Level of Function : Independent/Modified Independent;Driving;Working/employed             Mobility Comments: Independent without AD, denies falls ADLs Comments: Ind in all ADL's and IADL's - 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        OT Problem List:        OT Treatment/Interventions:      OT Goals(Current goals can be found in the care plan section) Acute Rehab OT Goals Patient Stated Goal: go home - get stronger and ind OT Goal Formulation: With patient/family Time For Goal Achievement: 02/19/22  OT Frequency:      Co-evaluation              AM-PAC  OT "6 Clicks" Daily Activity     Outcome Measure                 End of Session Equipment Utilized During Treatment: Gait belt  Activity Tolerance: Patient tolerated treatment well Patient left: in chair;with family/visitor present                   Time: 0900-1000 OT Time Calculation (min): 60 min Charges:  OT General Charges $OT Visit: 1 Visit OT Treatments $Self Care/Home Management : 8-22 mins   Rosalyn Gess OTR/L,CLT 02/12/2022, 12:47 PM

## 2022-02-12 NOTE — Progress Notes (Signed)
Physical Therapy Treatment Patient Details Name: Justin Lara MRN: [id number] DOB: 1955/12/17 Today's Date: 02/12/2022   History of Present Illness Pt is a 67 y/o M admitted on 02/11/22 for scheduled L TKA. PMH: OA, chicken pox, thyroid disease    PT Comments    Pt was long sitting in bed upon arriving. He is A and O x 4. Agrees to session and remains cooperative and motivated throughout. BP upon sitting up EOB 121/76. No symptoms of dizziness or syncopal. Session progressed to pt standing and ambulated ~ 200 ft. No physical assistance required to stand or ambulate. Safely demonstrated correct performance of stairs to simulate home entry. Chief Strategy Officer discussed importance of routine mobility, use of polar care, bone foam/positioning, and what to expect with HHPT. Pt states understanding. ROM + strength are progressing well. Will continue to benefit from skilled PT at DC to maximize ROM, strength, and independence with all ADLs. Pt is cleared from an acute PT standpoint for safe DC home with HHPT to follow. Will need RW and BSC prior to DC. Orders are placed and SW aware.    Recommendations for follow up therapy are one component of a multi-disciplinary discharge planning process, led by the attending physician.  Recommendations may be updated based on patient status, additional functional criteria and insurance authorization.  Follow Up Recommendations  Home health PT     Assistance Recommended at Discharge Set up Supervision/Assistance  Patient can return home with the following A little help with walking and/or transfers;A little help with bathing/dressing/bathroom;Assistance with cooking/housework;Assist for transportation;Help with stairs or ramp for entrance   Equipment Recommendations  Rolling walker (2 wheels);BSC/3in1       Precautions / Restrictions Precautions Precautions: Fall Restrictions Weight Bearing Restrictions: Yes LLE Weight Bearing: Weight bearing as tolerated      Mobility  Bed Mobility Overal bed mobility: Modified Independent Bed Mobility: Supine to Sit  Supine to sit: Modified independent (Device/Increase time)  General bed mobility comments: Pt was easily and safety able to exit bed without physical assistance or VCs. BP in sitting 121/76    Transfers Overall transfer level: Needs assistance Equipment used: Rolling walker (2 wheels) Transfers: Sit to/from Stand Sit to Stand: Supervision    General transfer comment: pt demonstarted safe abilities to stand from lowest bed height without physical assistance.    Ambulation/Gait Ambulation/Gait assistance: Supervision Gait Distance (Feet): 200 Feet Assistive device: Rolling walker (2 wheels) Gait Pattern/deviations: Step-through pattern Gait velocity: decreased     General Gait Details: Pt was safely able to ambulate ~ 200 ft with RW. no LOB. Antalgic gait but was able to advance to step through pattern/sequencing.   Stairs Stairs: Yes Stairs assistance: Supervision Stair Management: One rail Right, Step to pattern, Forwards, Backwards, With walker Number of Stairs: 4 General stair comments: Pt was able to ascend/decsned 4 stair with +1 left rail. Also safely performed ascending/descending 2 stair without rails, backwards with RW    Balance Overall balance assessment: Modified Independent      Cognition Arousal/Alertness: Awake/alert Behavior During Therapy: WFL for tasks assessed/performed Overall Cognitive Status: Within Functional Limits for tasks assessed      General Comments: Pt is A and O x 4        Exercises Total Joint Exercises Goniometric ROM: ~4-88 degrees    General Comments General comments (skin integrity, edema, etc.): Reviewed importance of ther ex, routine mobility, polar care use and need expectations for [facility] services. Pt states understanding and is eager for DC  home. PA in room at conclusion of session.      Pertinent Vitals/Pain Pain  Assessment Pain Assessment: 0-10 Pain Score: 4  Pain Location: L knee Pain Descriptors / Indicators: Discomfort, Grimacing Pain Intervention(s): Limited activity within patient's tolerance, Monitored during session, Premedicated before session, Repositioned, Ice applied     PT Goals (current goals can now be found in the care plan section) Acute Rehab PT Goals Patient Stated Goal: go home Progress towards PT goals: Progressing toward goals    Frequency    BID      PT Plan Current plan remains appropriate       AM-PAC PT "6 Clicks" Mobility   Outcome Measure  Help needed turning from your back to your side while in a flat bed without using bedrails?: None Help needed moving from lying on your back to sitting on the side of a flat bed without using bedrails?: A Little Help needed moving to and from a bed to a chair (including a wheelchair)?: A Little Help needed standing up from a chair using your arms (e.g., wheelchair or bedside chair)?: A Little Help needed to walk in hospital room?: A Little Help needed climbing 3-5 steps with a railing? : A Little 6 Click Score: 19    End of Session   Activity Tolerance: Patient tolerated treatment well Patient left: in chair;with call bell/phone within reach;with family/visitor present;with nursing/sitter in room Nurse Communication: Mobility status PT Visit Diagnosis: Other abnormalities of gait and mobility (R26.89);Difficulty in walking, not elsewhere classified (R26.2);Muscle weakness (generalized) (M62.81);Pain Pain - Right/Left: Left Pain - part of body: Knee     Time: 1751-0258 PT Time Calculation (min) (ACUTE ONLY): 24 min  Charges:  $Gait Training: 8-22 mins $Therapeutic Activity: 8-22 mins                     Julaine Fusi PTA 02/12/22, 8:45 AM

## 2022-02-12 NOTE — Plan of Care (Signed)
  Problem: Education: Goal: Individualized Educational Video(s) Outcome: Progressing   Problem: Activity: Goal: Range of joint motion will improve Outcome: Progressing   Problem: Clinical Measurements: Goal: Postoperative complications will be avoided or minimized Outcome: Progressing   Problem: Education: Goal: Knowledge of General Education information will improve Description: Including pain rating scale, medication(s)/side effects and non-pharmacologic comfort measures Outcome: Progressing   Problem: Clinical Measurements: Goal: Diagnostic test results will improve Outcome: Progressing   Problem: Clinical Measurements: Goal: Cardiovascular complication will be avoided Outcome: Progressing

## 2022-02-12 NOTE — Progress Notes (Signed)
Foley catheter removed at 6 am output of 659ml, patient educate and informed about the recommendation of the urologist, patient wants the foley catheter to be out.

## 2022-02-12 NOTE — Progress Notes (Signed)
Patient is not able to walk the distance required to go the bathroom, or he/she is unable to safely negotiate stairs required to access the bathroom.  A 3in1 BSC will alleviate this problem  

## 2022-02-14 ENCOUNTER — Encounter: Payer: Self-pay | Admitting: Orthopedic Surgery

## 2022-02-14 NOTE — Anesthesia Postprocedure Evaluation (Signed)
Anesthesia Post Note  Patient: Justin Lara  Procedure(s) Performed: COMPUTER ASSISTED TOTAL KNEE ARTHROPLASTY - RNFA (Left: Knee)  Patient location during evaluation: PACU Anesthesia Type: Combined General/Spinal Level of consciousness: awake and alert Pain management: pain level controlled Vital Signs Assessment: post-procedure vital signs reviewed and stable Respiratory status: spontaneous breathing, nonlabored ventilation and respiratory function stable Cardiovascular status: blood pressure returned to baseline and stable Postop Assessment: no apparent nausea or vomiting Anesthetic complications: no   No notable events documented.   Last Vitals:  Vitals:   02/11/22 2358 02/12/22 0921  BP: 107/63 114/88  Pulse: (!) 52 67  Resp: 18 17  Temp: 36.4 C 36.7 C  SpO2: 96% 100%    Last Pain:  Vitals:   02/12/22 1027  TempSrc:   PainSc: 0-No pain                 [place] Ouch

## 2022-06-21 ENCOUNTER — Ambulatory Visit (INDEPENDENT_AMBULATORY_CARE_PROVIDER_SITE_OTHER): Payer: BC Managed Care – PPO | Admitting: Urology

## 2022-06-21 ENCOUNTER — Encounter: Payer: Self-pay | Admitting: Urology

## 2022-06-21 VITALS — BP 131/80 | HR 71 | Ht 72.0 in | Wt 214.0 lb

## 2022-06-21 DIAGNOSIS — R972 Elevated prostate specific antigen [PSA]: Secondary | ICD-10-CM

## 2022-06-21 DIAGNOSIS — N529 Male erectile dysfunction, unspecified: Secondary | ICD-10-CM

## 2022-06-21 DIAGNOSIS — N35811 Other urethral stricture, male, meatal: Secondary | ICD-10-CM

## 2022-06-21 DIAGNOSIS — Z8042 Family history of malignant neoplasm of prostate: Secondary | ICD-10-CM

## 2022-06-21 MED ORDER — TADALAFIL 10 MG PO TABS: 10.0000 mg | ORAL_TABLET | Freq: Every day | ORAL | 11 refills | Status: DC | PRN

## 2022-06-21 NOTE — Patient Instructions (Signed)

## 2022-06-21 NOTE — Progress Notes (Signed)
06/21/22 3:00 PM   RAMIEL PERNICIARO 08/04/1955 [id number]  CC: PSA screening, meatal stenosis, ED  HPI: 67 year old male previously followed by Dr. Sheppard Penton transferring his care after his retirement.  He had an elevated PSA of 4.7 in June 2022 and had a prostate MRI in July 2022 that showed a 48 g prostate with a PI-RADS 4 lesion in the right apical peripheral zone he underwent an MRI fusion biopsy which showed only benign tissue and chronic inflammation.  He also underwent a confirm MDX study with Dr. Evelene Croon in June 2023 that suggested a 34% chance of clinically significant prostate cancer on repeat biopsy.  PSA has decreased since that time including 3.8 in December 2022, 2.4 in June 2023, 2.9 in December 2023, and 3.18 in April 2024.  He also has a history of meatal stenosis and dilates his meatus weekly.  He denies any significant urinary symptoms at this time.  He actually had a orthopedic procedure in January 2024 and nursing was unable to place a Foley, I was actually consulted at that time, gently dilated the urethra with hemostat and placed a 14 French silicone Foley into the bladder.  He has a history of ED and previously was on 5 mg Cialis on demand through Dr. Sheppard Penton, this has been less effective recently.  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)    b 12   History of kidney stones    Hypothyroidism    Thrombocytopenia (HCC) 2017    Surgical History: Past Surgical History:  Procedure Laterality Date   CHOLECYSTECTOMY N/A 08/01/2017   Procedure: LAPAROSCOPIC CHOLECYSTECTOMY;  Surgeon: Sung Amabile, DO;  Location: [facility];  Service: General;  Laterality: N/A;   CHONDROPLASTY Left 12/02/2015   Procedure: CHONDROPLASTY;  Surgeon: Donato Heinz, MD;  Location: [facility];  Service: Orthopedics;  Laterality: Left;   COLONSCOPY     2007, 2012, 2017   CYSTOURETHROSCOPY   11/08/2018   EXTRACORPOREAL SHOCK WAVE LITHOTRIPSY Left 11/15/2018   Procedure: EXTRACORPOREAL SHOCK WAVE LITHOTRIPSY (ESWL);  Surgeon: Orson Ape, MD;  Location: [facility];  Service: Urology;  Laterality: Left;   KNEE ARTHROPLASTY Left 02/11/2022   Procedure: COMPUTER ASSISTED TOTAL KNEE ARTHROPLASTY - RNFA;  Surgeon: Donato Heinz, MD;  Location: [facility];  Service: Orthopedics;  Laterality: Left;   KNEE ARTHROSCOPY WITH MEDIAL MENISECTOMY Left 12/02/2015   Procedure: KNEE ARTHROSCOPY WITH MEDIAL MENISECTOMY;  Surgeon: Donato Heinz, MD;  Location: [facility];  Service: Orthopedics;  Laterality: Left;   PROSTATE BIOPSY N/A 08/20/2020   Procedure: PROSTATE BIOPSY Addison Bailey;  Surgeon: Orson Ape, MD;  Location: [facility];  Service: Urology;  Laterality: N/A;   TONSILLECTOMY       Social History:  reports that he has never smoked. He has never used smokeless tobacco. He reports that he does not drink alcohol and does not use drugs.  Physical Exam: BP 131/80   Pulse 71   Ht 6' (1.829 m)   Wt 214 lb (97.1 kg)   BMI 29.02 kg/m    Constitutional:  Alert and oriented, No acute distress. Cardiovascular: No clubbing, cyanosis, or edema. Respiratory: Normal respiratory effort, no increased work of breathing. GI: Abdomen is soft, nontender, nondistended, no abdominal masses Gu: Hypospadias with meatus on the ventral aspect of the phallus, widely patent, no lesions  Laboratory Data: HPI for PSA history  Pertinent Imaging: I have personally viewed and interpreted the prostate MRI from July 2022 showing a 48 g gland, PI-RADS 4 lesion at the right apical peripheral zone.  Assessment & Plan:   67 year old male with family history of prostate cancer, history of mildly elevated PSA of 4.7 in 2022 with prostate MRI showing a 48 g gland with a PI-RADS 4 lesion in the right apical peripheral zone, negative fusion biopsy with Dr. Sheppard Penton at that time.  PSA has since normalized, most recently  3.18.  Long history of meatal stenosis, reportedly underwent multiple prior procedures with Dr. Sheppard Penton.  He performs self dilation weekly with good results.  We discussed other options like Optilume balloon dilation, he would like to continue self dilations once weekly at this time.  Return precautions discussed.  In terms of his ED, I recommended increasing the Cialis to 10 mg on demand, max dose of 20 mg  -Prescription for Cialis 10 to 20 mg on demand sent to pharmacy -He prefers to continue PSA screening every 6 months, reassurance provided regarding negative biopsy and decreased PSA.  Will re-discuss at follow-up considering spacing to yearly if stable -Continue self urethral dilation weekly for long history of meatal stenosis, consider Optilume balloon dilation in the future if worsening symptoms  Legrand Rams, MD 06/21/2022  [facility] [address], [address] [place], [place] [address] [phone]

## 2022-07-03 ENCOUNTER — Ambulatory Visit
Admission: RE | Admit: 2022-07-03 | Discharge: 2022-07-03 | Disposition: A | Discharge status: Home / Self Care | Payer: BC Managed Care – PPO | Source: Ambulatory Visit | Attending: Emergency Medicine | Admitting: Emergency Medicine

## 2022-07-03 VITALS — BP 120/71 | HR 75 | Temp 99.2°F | Resp 18

## 2022-07-03 DIAGNOSIS — N390 Urinary tract infection, site not specified: Secondary | ICD-10-CM

## 2022-07-03 LAB — URINALYSIS, W/ REFLEX TO CULTURE (INFECTION SUSPECTED)
Glucose, UA: NEGATIVE mg/dL
Nitrite: NEGATIVE
Protein, ur: 100 mg/dL — AB
Specific Gravity, Urine: 1.025 (ref 1.005–1.030)
Squamous Epithelial / HPF: NONE SEEN /HPF (ref 0–5)
WBC, UA: >50 WBC/hpf (ref 0–5)
pH: 5.5 (ref 5.0–8.0)

## 2022-07-03 MED ORDER — CEFDINIR 300 MG PO CAPS: 300.0000 mg | ORAL_CAPSULE | Freq: Two times a day (BID) | ORAL | 0 refills | Status: AC

## 2022-07-03 NOTE — Discharge Instructions (Addendum)
-  Omnicef 300 mg twice a day for total of 7 days -Contact your urologist or primary care doctor within the next 7 days for a follow-up visit. -Take Tylenol or ibuprofen for pain, chills or fever. -Stay hydrated, drink enough water throughout the day. -Report new or worsening symptoms to your primary care provider or local emergency room.

## 2022-07-03 NOTE — ED Provider Notes (Signed)
[facility]    CSN: [id number] Arrival date & time: 07/03/22  1325      History   Chief Complaint Chief Complaint  Patient presents with   Urinary Tract Infection   HPI Justin Lara is a 67 y.o. male presented to the urgent care this evening with complaints of "possible UTI" for the past 24 hours.  Patient with a history of BPH has to self cath regularly (weekly). For the last 24 hours he started experiencing multiple symptoms. These including Chills, aching, burning with urination, to urinate, and decrease in appetite.  Also reports some nausea.  Denies fever, chest pain, shortness of breath or any other symptom than above. Patient reports he just finished a 10-day course of penicillin for his dental implant. He reports urinary tract infections in the past.   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   Primary osteoarthritis of left knee 05/23/2021   Elevated PSA 11/11/2020   B12 deficiency 01/27/2015   Chronic osteoarthritis 01/23/2015   Thrombocytopenia (HCC) 01/23/2015   Hypothyroid 01/21/2014   Pure hypercholesterolemia 01/21/2014    Past Surgical History:  Procedure Laterality Date   CHOLECYSTECTOMY N/A 08/01/2017   Procedure: LAPAROSCOPIC CHOLECYSTECTOMY;  Surgeon: Sung Amabile, DO;  Location: [facility];  Service: General;  Laterality: N/A;   CHONDROPLASTY Left 12/02/2015   Procedure: CHONDROPLASTY;  Surgeon: Donato Heinz, MD;  Location: [facility];  Service: Orthopedics;  Laterality: Left;   COLONSCOPY     2007, 2012, 2017   CYSTOURETHROSCOPY  11/08/2018   EXTRACORPOREAL SHOCK WAVE LITHOTRIPSY Left 11/15/2018   Procedure: EXTRACORPOREAL  SHOCK WAVE LITHOTRIPSY (ESWL);  Surgeon: Orson Ape, MD;  Location: [facility];  Service: Urology;  Laterality: Left;   KNEE ARTHROPLASTY Left 02/11/2022   Procedure: COMPUTER ASSISTED TOTAL KNEE ARTHROPLASTY - RNFA;  Surgeon: Donato Heinz, MD;  Location: [facility];  Service: Orthopedics;  Laterality: Left;   KNEE ARTHROSCOPY WITH MEDIAL MENISECTOMY Left 12/02/2015   Procedure: KNEE ARTHROSCOPY WITH MEDIAL MENISECTOMY;  Surgeon: Donato Heinz, MD;  Location: [facility];  Service: Orthopedics;  Laterality: Left;   PROSTATE BIOPSY N/A 08/20/2020   Procedure: PROSTATE BIOPSY Addison Bailey;  Surgeon: Orson Ape, MD;  Location: [facility];  Service: Urology;  Laterality: N/A;   TONSILLECTOMY       Home Medications    Prior to Admission medications   Medication Sig Start Date End Date Taking? Authorizing Provider  cefdinir (OMNICEF) 300 MG capsule Take 1 capsule (300 mg total) by mouth 2 (two) times daily for 7 days. 07/03/22 07/10/22 Yes Eleonore Chiquito, FNP  cholecalciferol (VITAMIN D3) 25 MCG (1000 UNIT) tablet Take 4,000 Units by mouth daily with supper.   Yes [provider]  Cyanocobalamin 1000 MCG TBCR Take 1,000 mcg by mouth daily before breakfast.   Yes [provider]  levothyroxine (SYNTHROID) 50 MCG tablet Take 50 mcg by mouth daily before breakfast. 05/26/22  Yes [provider]  senna-docusate (SENOKOT-S) 8.6-50 MG tablet Take 1 tablet by mouth 2 (two) times daily. 02/12/22  Yes Evon Slack, PA-C  tadalafil (CIALIS) 10 MG tablet  Take 1-2 tablets (10-20 mg total) by mouth daily as needed for erectile dysfunction (take 1 hour prior to sexual activity). 06/21/22  Yes Sondra Come, MD    Family History History reviewed. No pertinent family history.  Social History Social History   Tobacco Use   Smoking status: Never    Passive exposure: Never   Smokeless tobacco: Never  Vaping Use   Vaping Use: Never used  Substance Use Topics   Alcohol use: No    Drug use: No     Allergies   Patient has no known allergies.   Review of Systems Review of Systems  Constitutional:  Positive for appetite change and chills.  HENT: Negative.    Eyes: Negative.   Respiratory: Negative.    Cardiovascular: Negative.   Gastrointestinal: Negative.   Endocrine: Positive for polyuria.  Genitourinary:  Positive for dysuria and frequency.  Neurological: Negative.      Physical Exam Triage Vital Signs ED Triage Vitals  Enc Vitals Group     BP 07/03/22 1344 120/71     Pulse Rate 07/03/22 1344 75     Resp 07/03/22 1344 18     Temp 07/03/22 1344 99.2 F (37.3 C)     Temp Source 07/03/22 1344 Oral     SpO2 07/03/22 1344 97 %     Weight --      Height --      Head Circumference --      Peak Flow --      Pain Score 07/03/22 1343 0     Pain Loc --      Pain Edu? --      Excl. in GC? --    No data found.  Updated Vital Signs BP 120/71 (BP Location: Left Arm)   Pulse 75   Temp 99.2 F (37.3 C) (Oral)   Resp 18   SpO2 97%   Visual Acuity Right Eye Distance:   Left Eye Distance:   Bilateral Distance:    Right Eye Near:   Left Eye Near:    Bilateral Near:     Physical Exam Constitutional:      Appearance: Normal appearance.  Cardiovascular:     Rate and Rhythm: Normal rate and regular rhythm.     Pulses: Normal pulses.     Heart sounds: Normal heart sounds.  Pulmonary:     Effort: Pulmonary effort is normal.     Breath sounds: Normal breath sounds.  Abdominal:     General: Bowel sounds are normal.  Neurological:     Mental Status: He is alert and oriented to person, place, and time.  Psychiatric:        Behavior: Behavior normal.      UC Treatments / Results  Labs (all labs ordered are listed, but only abnormal results are displayed) Labs Reviewed  URINALYSIS, W/ REFLEX TO CULTURE (INFECTION SUSPECTED) - Abnormal; Notable for the following components:      Result Value   Color, Urine AMBER (*)    APPearance CLOUDY  (*)    Hgb urine dipstick MODERATE (*)    Bilirubin Urine SMALL (*)    Ketones, ur TRACE (*)    Protein, ur 100 (*)    Leukocytes,Ua LARGE (*)    Bacteria, UA MANY (*)    All other components within normal limits  URINE CULTURE    EKG   Radiology No results found.  Procedures Procedures (including critical care time)  Medications Ordered in UC Medications -  No data to display  Initial Impression / Assessment and Plan / UC Course  I have reviewed the triage vital signs and the nursing notes.  Pertinent labs & imaging results that were available during my care of the patient were reviewed by me and considered in my medical decision making (see chart for details).    Patient is a 67 year old male who presents with a 24-hour is history of chills, aching, dysuria and  polyuria.  He has to self cath every 7 days.  Urinalysis showed large amount of leukocytes with many bacteria's.  Negative for nitrite.  Urine color is amber and cloudy, has moderate hemoglobin and small bilirubin with trace of ketones and protein.Benzonatate urine, is amber and cloudy. patient is diagnosed with urinary tract infection and prescribed cefdinir 300 mg p.o. twice daily for 7 days with instructions to report new or worsening symptoms to his primary care provider or urologist.  Patient reminded to call and make an appointment to see his urologist or primary care provider within the next 7 days, verbalized understanding. Final Clinical Impressions(s) / UC Diagnoses   Final diagnoses:  Urinary tract infection in male     Discharge Instructions      -Omnicef 300 mg twice a day for total of 7 days -Contact your urologist or primary care doctor within the next 7 days for a follow-up visit. -Take Tylenol or ibuprofen for pain, chills or fever. -Stay hydrated, drink enough water throughout the day. -Report new or worsening symptoms to your primary care provider or local emergency room.   ED Prescriptions      Medication Sig Dispense Auth. Provider   cefdinir (OMNICEF) 300 MG capsule Take 1 capsule (300 mg total) by mouth 2 (two) times daily for 7 days. 14 capsule Eleonore Chiquito, FNP      PDMP not reviewed this encounter.   Eleonore Chiquito, FNP 07/03/22 1526

## 2022-07-03 NOTE — ED Triage Notes (Signed)
Uses cath. Possible UTI, Chills,body ache, dry mouth. Sx for 24 hrs.  Hx of UTI. Just finished 10 days of penicillin for dental procedure.

## 2022-07-05 LAB — URINE CULTURE: Culture: 30000 — AB

## 2022-07-08 ENCOUNTER — Telehealth: Payer: Self-pay

## 2022-07-08 NOTE — Telephone Encounter (Signed)
Called pt, he states that he plans to f/u with [facility] as he has already been scheduled there and has a plan of care.

## 2022-07-08 NOTE — Telephone Encounter (Signed)
-----   Message from Sondra Come, MD sent at 07/08/2022  2:48 PM EDT ----- Regarding: kidney stone This patient was stented at [facility] for a kidney stone with infection.  He had sent me a MyChart message requesting follow-up with [place].  Please follow-up with him and see if he would like to have his follow-up ureteroscopy done here in 2 to 3 weeks, or at [facility].  If he would like to schedule with [place], just send me a message and I can send orders to [facility]  Thanks Legrand Rams, MD 07/08/2022

## 2022-08-30 ENCOUNTER — Ambulatory Visit: Payer: BC Managed Care – PPO | Admitting: Urology

## 2022-08-30 ENCOUNTER — Encounter: Payer: Self-pay | Admitting: Urology

## 2022-08-30 VITALS — BP 122/74 | HR 66 | Ht 72.0 in | Wt 207.0 lb

## 2022-08-30 DIAGNOSIS — N200 Calculus of kidney: Secondary | ICD-10-CM

## 2022-08-30 DIAGNOSIS — N35811 Other urethral stricture, male, meatal: Secondary | ICD-10-CM

## 2022-08-30 DIAGNOSIS — Z125 Encounter for screening for malignant neoplasm of prostate: Secondary | ICD-10-CM | POA: Diagnosis not present

## 2022-08-30 DIAGNOSIS — R310 Gross hematuria: Secondary | ICD-10-CM

## 2022-08-30 DIAGNOSIS — N529 Male erectile dysfunction, unspecified: Secondary | ICD-10-CM | POA: Diagnosis not present

## 2022-08-30 DIAGNOSIS — R972 Elevated prostate specific antigen [PSA]: Secondary | ICD-10-CM

## 2022-08-30 DIAGNOSIS — Z87442 Personal history of urinary calculi: Secondary | ICD-10-CM

## 2022-08-30 LAB — BLADDER SCAN AMB NON-IMAGING

## 2022-08-30 NOTE — Patient Instructions (Signed)
Prostate Cancer Screening  Prostate cancer screening is testing that is done to check for the presence of prostate cancer in men. The prostate gland is a walnut-sized gland that is located below the bladder and in front of the rectum in males. The function of the prostate is to add fluid to semen during ejaculation. Prostate cancer is one of the most common types of cancer in men. Who should have prostate cancer screening? Screening recommendations vary based on age and other risk factors, as well as between the professional organizations who make the recommendations. In general, screening is recommended if: You are age 62 to 36 and have an average risk for prostate cancer. You should talk with your health care provider about your need for screening and how often screening should be done. Because most prostate cancers are slow growing and will not cause death, screening in this age group is generally reserved for men who have a 10- to 15-year life expectancy. You are younger than age 14, and you have these risk factors: Having a father, brother, or uncle who has been diagnosed with prostate cancer. The risk is higher if your family member's cancer occurred at an early age or if you have multiple family members with prostate cancer at an early age. Being a male who is [place] or is of [place] or sub-Saharan African descent. In general, screening is not recommended if: You are younger than age 26. You are between the ages of 77 and 73 and you have no risk factors. You are 36 years of age or older. At this age, the risks that screening can cause are greater than the benefits that it may provide. If you are at high risk for prostate cancer, your health care provider may recommend that you have screenings more often or that you start screening at a younger age. How is screening for prostate cancer done? The recommended prostate cancer screening test is a blood test called the prostate-specific antigen  (PSA) test. PSA is a protein that is made in the prostate. As you age, your prostate naturally produces more PSA. Abnormally high PSA levels may be caused by: Prostate cancer. An enlarged prostate that is not caused by cancer (benign prostatic hyperplasia, or BPH). This condition is very common in older men. A prostate gland infection (prostatitis) or urinary tract infection. Certain medicines such as male hormones (like testosterone) or other medicines that raise testosterone levels. A rectal exam may be done as part of prostate cancer screening to help provide information about the size of your prostate gland. When a rectal exam is performed, it should be done after the PSA level is drawn to avoid any effect on the results. Depending on the PSA results, you may need more tests, such as: A physical exam to check the size of your prostate gland, if not done as part of screening. Blood and imaging tests. A procedure to remove tissue samples from your prostate gland for testing (biopsy). This is the only way to know for certain if you have prostate cancer. What are the benefits of prostate cancer screening? Screening can help to identify cancer at an early stage, before symptoms start and when the cancer can be treated more easily. There is a small chance that screening may lower your risk of dying from prostate cancer. The chance is small because prostate cancer is a slow-growing cancer, and most men with prostate cancer die from a different cause. What are the risks of prostate cancer screening? The  main risk of prostate cancer screening is diagnosing and treating prostate cancer that would never have caused any symptoms or problems. This is called overdiagnosisand overtreatment. PSA screening cannot tell you if your PSA is high due to cancer or a different cause. A prostate biopsy is the only procedure to diagnose prostate cancer. Even the results of a biopsy may not tell you if your cancer needs to  be treated. Slow-growing prostate cancer may not need any treatment other than monitoring, so diagnosing and treating it may cause unnecessary stress or other side effects. Questions to ask your health care provider When should I start prostate cancer screening? What is my risk for prostate cancer? How often do I need screening? What type of screening tests do I need? How do I get my test results? What do my results mean? Do I need treatment? Where to find more information The American Cancer Society: www.cancer.org American Urological Association: www.auanet.org Contact a health care provider if: You have difficulty urinating. You have pain when you urinate or ejaculate. You have blood in your urine or semen. You have pain in your back or in the area of your prostate. Summary Prostate cancer is a common type of cancer in men. The prostate gland is located below the bladder and in front of the rectum. This gland adds fluid to semen during ejaculation. Prostate cancer screening may identify cancer at an early stage, when the cancer can be treated more easily and is less likely to have spread to other areas of the body. The prostate-specific antigen (PSA) test is the recommended screening test for prostate cancer, but it has associated risks. Discuss the risks and benefits of prostate cancer screening with your health care provider. If you are age 40 or older, the risks that screening can cause are greater than the benefits that it may provide. This information is not intended to replace advice given to you by your health care provider. Make sure you discuss any questions you have with your health care provider. Document Revised: 06/29/2020 Document Reviewed: 06/29/2020 Elsevier Patient Education  2024 Elsevier Inc.  Urethral Stricture  Urethral stricture is when the tube that drains pee (urine) from the bladder out of the body (urethra) becomes too narrow. The urethra can become narrow  because of scar tissue, infection, surgery, or an injury. This can make it difficult to pee (urinate). In females, the urethra opens above the vaginal opening. In males, the urethra opens at the tip of the penis, and the urethra is much longer than it is in females. Because of the length of the male urethra, urethral stricture is much more common in males. What are the causes? In males and females, common causes of urethral stricture include: Urinary tract infection (UTI). Sexually transmitted infection (STI). Using a soft tube in the urethra to drain pee from the bladder (urinary catheter). Urinary tract surgery. In males, common causes of urethral stricture include: A severe injury to the pelvis. Prostate surgery. Injury to the penis. In many cases, the cause of urethral stricture is not known. What increases the risk? You are more likely to develop this condition if you: Are male. Males who have had prostate surgery are at risk of developing this condition. Use a urinary catheter. Have had urinary tract surgery. What are the signs or symptoms? The main symptom of this condition is trouble peeing. This may cause decreased pee flow, dribbling, or spraying of pee. Other symptom of this condition may include: Frequent UTIs. Blood  in the pee. Pain when peeing. Swelling of the penis in males. Not being able to pee. How is this diagnosed? This condition may be diagnosed based on: Your medical history and a physical exam. Tests of your pee to check for infection or bleeding. X-rays. Ultrasound. Retrograde urethrogram. With this test, a dye is injected into the urethra and then an X-ray is taken. Urethroscopy. This is when a thin tube with a light and camera on the end (urethroscope) is used to look at the urethra. A CT scan or MRI. How is this treated? This condition is treated with surgery or other procedures. The type of surgery that you have depends on the severity of your condition.  You may have: Urethral dilation. In this procedure, the narrow part of the urethra is stretched open (dilated) with dilating instruments or a small balloon. Urethrotomy. In this procedure, a urethroscope is placed into the urethra, and the narrow part of the urethra is cut open with a surgical blade or laser inserted through the urethroscope. Urethroplasty. In this procedure, an incision is made in the urethra and the narrow part is removed. Then, the urethra is reconstructed. Follow these instructions at home: Take over-the-counter and prescription medicines only as told by your health care provider. If you were prescribed antibiotics, take them as told by your provider. Do not stop using the antibiotic even if you start to feel better. Drink enough fluid to keep your pee pale yellow. Keep all follow-up visits. Your provider will check your healing and adjust your treatment plan as needed. Contact a health care provider if: You have frequent peeing or you are only peeing small amounts often. You feel the need to pee urgently. You have pain or burning when you pee. Your pee smells bad or unusual. Your pee is bloody or cloudy. You have pain in your lower abdomen or back. Your genital area is swollen, bruised, or discolored. This includes: The penis, scrotum, and inner thighs for males. The outer genital organs (vulva) and inner thighs for females. You have a fever. You develop swelling in your legs. Get help right away if: You cannot pee. You have trouble breathing. These symptoms may be an emergency. Get help right away. Call 911. Do not wait to see if the symptoms will go away. Do not drive yourself to the hospital. This information is not intended to replace advice given to you by your health care provider. Make sure you discuss any questions you have with your health care provider. Document Revised: 10/28/2021 Document Reviewed: 10/28/2021 Elsevier Patient Education  2024 [employer].

## 2022-08-30 NOTE — Progress Notes (Signed)
   08/30/2022 12:48 PM   Justin Lara [birth year] [id number]  Reason for visit: History of elevated PSA, urethral stricture disease, nephrolithiasis, UTI, ED  HPI: 67 year old male who was previously followed by Dr. Sheppard Penton and transferred his care to [place] in January 2024 for the above issues.  In terms of his PSA history, he had an elevated PSA of 4.7 in June 2022 and a prostate MRI in July 2022 showed a 48 g prostate with a PI-RADS 4 lesion in the right apical peripheral zone, MRI fusion biopsy showed only benign tissue with chronic inflammation.  Repeat PSA decreased, including 3.8 in December 2022, 2.4 in June 2023, 2.9 in December 2023, and 3.18 in April 2024.  Reassurance has been provided regarding his negative workup thus far and normal PSA, we reviewed the AUA guidelines that recommend screening every 2 to 4 years, he has a fair amount of concerned about his PSA and is interested in screening every 6 to 12 months.  Risks and benefits discussed at length, as well as extraneous factors that can cause false elevation in the PSA.  Will plan to repeat PSA around April 2024.  He also has a history of urethral stricture disease and meatal stenosis and previously was performing self dilations weekly.  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  PVR today is normal at 0ml.  He was recently hospitalized in June 2024 at [facility] for left ureteral stone with UTI.  They originally attempted stent placement and were unable to identify the left ureteral orifice and place a stent, he was ultimately unable to get a nephrostomy tube secondary to some scheduling issues, and a repeat cystoscopy with successful at placing a left ureteral stent.  He underwent follow-up definitive ureteroscopy and laser lithotripsy,  urethral dilation on 07/27/2022, and had diffuse urethral stricture disease that was dilated up to 20 [place] and a catheter was placed.  His stent and Foley have since been removed. He currently is catheterizing every 1 to 2 days and feels like things have been much better lately.  Stone prevention strategies were discussed.  We also discussed other alternative options for his urethral stricture aside from routine dilation including optilume balloon dilation or referral to consider more complex urethroplasty.  He would like to continue urethral dilation every 1 to 2 days, but may consider optilume dilation in the future.  He was diagnosed with a postop UTI on 08/12/2022 and treated with antibiotics with resolution of his symptoms, culture was ultimately negative with only mixed flora.  Continues to use Cialis 10 to 20 mg on demand with good results for ED.  Continue Cialis for ED Stone prevention strategies discussed Most recent PSA normal, continue PSA screening every 1 to 2 years Continue self urethral dilations every 1 to 2 days, consider optilume balloon dilation in the future   Sondra Come, MD  [facility] [address] [place], [place] [address] [phone]

## 2022-12-20 ENCOUNTER — Ambulatory Visit: Payer: BC Managed Care – PPO | Admitting: Urology

## 2022-12-20 ENCOUNTER — Encounter: Payer: Self-pay | Admitting: Urology

## 2022-12-20 VITALS — BP 131/78 | HR 69 | Ht 72.0 in | Wt 215.0 lb

## 2022-12-20 DIAGNOSIS — Z87442 Personal history of urinary calculi: Secondary | ICD-10-CM

## 2022-12-20 DIAGNOSIS — Z87898 Personal history of other specified conditions: Secondary | ICD-10-CM

## 2022-12-20 DIAGNOSIS — N200 Calculus of kidney: Secondary | ICD-10-CM

## 2022-12-20 DIAGNOSIS — Z87448 Personal history of other diseases of urinary system: Secondary | ICD-10-CM | POA: Diagnosis not present

## 2022-12-20 DIAGNOSIS — R972 Elevated prostate specific antigen [PSA]: Secondary | ICD-10-CM

## 2022-12-20 DIAGNOSIS — Z125 Encounter for screening for malignant neoplasm of prostate: Secondary | ICD-10-CM

## 2022-12-20 DIAGNOSIS — N35914 Unspecified anterior urethral stricture, male: Secondary | ICD-10-CM

## 2022-12-20 LAB — BLADDER SCAN AMB NON-IMAGING

## 2022-12-20 MED ORDER — TADALAFIL 10 MG PO TABS: 5.0000 mg | ORAL_TABLET | Freq: Every day | ORAL | 11 refills | Status: DC | PRN

## 2022-12-20 NOTE — Progress Notes (Signed)
   12/20/2022 1:25 PM   Justin Lara 25-Jun-1955 [id number]  Reason for visit: History of elevated PSA, urethral stricture disease, nephrolithiasis, UTI, ED  HPI: 67 year old male who was previously followed by Dr. Evelene Croon and transferred his care to [place] in January 2024 for the above issues.  In terms of his PSA history, he had an elevated PSA of 4.7 in June 2022 and a prostate MRI in July 2022 showed a 48 g prostate with a PI-RADS 4 lesion in the right apical peripheral zone, MRI fusion biopsy showed only benign tissue with chronic inflammation.  Repeat PSA decreased, including 3.8 in December 2022, 2.4 in June 2023, 2.9 in December 2023, and 3.18 in April 2024.  Reassurance has been provided regarding his negative workup thus far and normal PSA, we reviewed the AUA guidelines that recommend screening every 2 to 4 years, he has a fair amount of concerned about his PSA and is interested in screening every 6 to 12 months.  Risks and benefits discussed at length, as well as extraneous factors that can cause false elevation in the PSA.  Will plan to repeat PSA around April 2024, with his good health can consider continuing screening through age 40.  He also has a history of urethral stricture disease and meatal stenosis and previously was performing self dilations weekly.  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  PVR today is normal at 0ml.  He is currently performing self dilations every other day with disposable catheters, and urination is going well, no significant urinary complaints today.  He was hospitalized in June 2024 at [facility] for left ureteral stone with UTI, stent placement was initially unsuccessful, was unable to get nephrostomy tube secondary to apparent scheduling issues, and ultimately  underwent a repeat cystoscopy with successful stent placement.  He underwent follow-up definitive ureteroscopy and laser lithotripsy as well as urethral dilation. We discussed general stone prevention strategies including adequate hydration with goal of producing 2.5 L of urine daily, increasing citric acid intake, increasing calcium intake during high oxalate meals, minimizing animal protein, and decreasing salt intake. Information about dietary recommendations given today.   Continues to use Cialis 10 to 20 mg on demand with good results for ED, refilled.  -Continue Cialis 5 to 20 mg as needed for ED, refilled -Stone prevention strategies discussed -Most recent PSA normal, continue PSA screening every 1 to 2 years-lab visit placed for April 2025(will message with results) -Continue self urethral dilations every 1 to 2 days, consider optilume balloon dilation in the future -RTC 1 year PVR   Sondra Come, MD  [facility] [address] [place], [place] [address] [phone]

## 2023-11-08 DIAGNOSIS — N529 Male erectile dysfunction, unspecified: Secondary | ICD-10-CM

## 2023-11-08 MED ORDER — TADALAFIL 10 MG PO TABS: 5.0000 mg | ORAL_TABLET | Freq: Every day | ORAL | 3 refills | Status: DC | PRN

## 2023-12-19 ENCOUNTER — Ambulatory Visit: Payer: Self-pay | Admitting: Urology

## 2023-12-19 ENCOUNTER — Ambulatory Visit
Admission: RE | Admit: 2023-12-19 | Discharge: 2023-12-19 | Disposition: A | Source: Ambulatory Visit | Attending: Urology

## 2023-12-19 ENCOUNTER — Ambulatory Visit: Admission: RE | Admit: 2023-12-19 | Discharge: 2023-12-19 | Disposition: A | Attending: Urology | Admitting: Urology

## 2023-12-19 VITALS — BP 123/73 | HR 67 | Wt 215.0 lb

## 2023-12-19 DIAGNOSIS — R972 Elevated prostate specific antigen [PSA]: Secondary | ICD-10-CM

## 2023-12-19 DIAGNOSIS — N529 Male erectile dysfunction, unspecified: Secondary | ICD-10-CM | POA: Diagnosis not present

## 2023-12-19 DIAGNOSIS — N35914 Unspecified anterior urethral stricture, male: Secondary | ICD-10-CM | POA: Diagnosis not present

## 2023-12-19 LAB — BLADDER SCAN AMB NON-IMAGING

## 2023-12-19 MED ORDER — TADALAFIL 10 MG PO TABS: 10.0000 mg | ORAL_TABLET | ORAL | 6 refills | Status: AC

## 2023-12-19 NOTE — Progress Notes (Signed)
   12/19/2023 1:54 PM   Justin Lara 1955/08/11 [id number]  Reason for visit: Follow up urethral stricture, elevated PSA, nephrolithiasis, ED  History: Previously followed by Dr. Gala, transferred care to [facility] urology January 2024 History of elevated PSA up to 4.23 June 2020, prostate MRI showed 48 g prostate with PI-RADS 4 lesion, MRI fusion biopsy was benign with chronic inflammation, PSA decreased since that time in the 3 range History of fossa navicularis stricture, passes catheter daily for self dilation History of kidney stones and UTI requiring stent placement and ureteroscopy at [facility] in 2024 Cialis  for ED  Physical Exam: BP 123/73 (BP Location: Left Arm, Patient Position: Sitting, Cuff Size: Normal)   Pulse 67   Wt 215 lb (97.5 kg)   SpO2 96%   BMI 29.16 kg/m   Imaging/labs: Most recent PSA April 2024 3.18 Urinalysis November 2025 benign I personally viewed and interpreted the KUB today with no obvious evidence of stone disease  Today: Overall doing well Voiding well with self dilations daily Cialis  10 mg as needed working okay, interested in increasing the dose Due for PSA screening No stone events since her last visit  Plan:   Nephrolithiasis: KUB today benign, prevention strategies were discussed Fossa navicularis stricture: Continue self dilations daily, going well PSA screening: History of negative biopsy, due for repeat PSA today ED: Will increase Cialis  dose to 10 mg daily or every other day with 10 mg boost dose as needed, refill Call with PHI score results, RTC 1 year KUB, PSA prior   Justin JAYSON Burnet, MD  [facility] [address], [address] [place], [place] [address] [phone]

## 2023-12-19 NOTE — Patient Instructions (Signed)

## 2023-12-20 ENCOUNTER — Other Ambulatory Visit

## 2023-12-21 ENCOUNTER — Ambulatory Visit: Payer: Self-pay | Admitting: Urology

## 2023-12-21 LAB — PHI SCORE REFLEX
% Free PSA: 19.2 %
PSA, Free: 0.84 ng/mL
Prostate Heath Index Score: 44.5
p2PSA: 17.9 pg/mL

## 2023-12-21 LAB — PROSTATE HEALTH INDEX: Prostate Specific Ag: 4.4 ng/mL — ABNORMAL HIGH (ref 0.0–3.9)

## 2024-12-19 ENCOUNTER — Other Ambulatory Visit

## 2024-12-25 ENCOUNTER — Ambulatory Visit: Admitting: Urology
